# Patient Record
Sex: Male | Born: 1950 | Race: White | Hispanic: No | Marital: Married | Smoking: Former smoker
Health system: Southern US, Community
[De-identification: ages and names within clinical notes are randomized; demographics above are authoritative.]

## PROBLEM LIST (undated history)

## (undated) DIAGNOSIS — N200 Calculus of kidney: Secondary | ICD-10-CM

## (undated) DIAGNOSIS — E785 Hyperlipidemia, unspecified: Secondary | ICD-10-CM

## (undated) DIAGNOSIS — I441 Atrioventricular block, second degree: Secondary | ICD-10-CM

## (undated) DIAGNOSIS — I10 Essential (primary) hypertension: Secondary | ICD-10-CM

## (undated) HISTORY — DX: Essential (primary) hypertension: I10

## (undated) HISTORY — DX: Atrioventricular block, second degree: I44.1

## (undated) HISTORY — PX: SHOULDER SURGERY: SHX246

## (undated) HISTORY — DX: Hyperlipidemia, unspecified: E78.5

---

## 1999-07-22 ENCOUNTER — Ambulatory Visit (HOSPITAL_COMMUNITY): Admission: RE | Admit: 1999-07-22 | Discharge: 1999-07-22 | Payer: Self-pay | Admitting: Interventional Cardiology

## 1999-11-22 ENCOUNTER — Encounter: Admission: RE | Admit: 1999-11-22 | Discharge: 1999-11-22 | Payer: Self-pay | Admitting: Urology

## 1999-11-22 ENCOUNTER — Encounter: Payer: Self-pay | Admitting: Emergency Medicine

## 1999-11-22 ENCOUNTER — Encounter: Payer: Self-pay | Admitting: Urology

## 1999-11-22 ENCOUNTER — Emergency Department (HOSPITAL_COMMUNITY): Admission: EM | Admit: 1999-11-22 | Discharge: 1999-11-22 | Payer: Self-pay | Admitting: Emergency Medicine

## 1999-11-30 ENCOUNTER — Encounter: Admission: RE | Admit: 1999-11-30 | Discharge: 1999-11-30 | Payer: Self-pay | Admitting: Urology

## 1999-11-30 ENCOUNTER — Encounter: Payer: Self-pay | Admitting: Urology

## 1999-12-07 ENCOUNTER — Encounter: Admission: RE | Admit: 1999-12-07 | Discharge: 1999-12-07 | Payer: Self-pay | Admitting: Urology

## 1999-12-07 ENCOUNTER — Encounter: Payer: Self-pay | Admitting: Urology

## 1999-12-16 ENCOUNTER — Encounter: Payer: Self-pay | Admitting: Urology

## 1999-12-19 ENCOUNTER — Ambulatory Visit (HOSPITAL_COMMUNITY): Admission: RE | Admit: 1999-12-19 | Discharge: 1999-12-19 | Payer: Self-pay | Admitting: Urology

## 1999-12-23 ENCOUNTER — Encounter: Payer: Self-pay | Admitting: Urology

## 1999-12-23 ENCOUNTER — Encounter: Admission: RE | Admit: 1999-12-23 | Discharge: 1999-12-23 | Payer: Self-pay | Admitting: Urology

## 2000-01-03 ENCOUNTER — Encounter: Admission: RE | Admit: 2000-01-03 | Discharge: 2000-01-03 | Payer: Self-pay | Admitting: Urology

## 2000-01-03 ENCOUNTER — Encounter: Payer: Self-pay | Admitting: Urology

## 2000-06-19 ENCOUNTER — Encounter: Payer: Self-pay | Admitting: Urology

## 2000-06-19 ENCOUNTER — Encounter: Admission: RE | Admit: 2000-06-19 | Discharge: 2000-06-19 | Payer: Self-pay | Admitting: Urology

## 2000-06-22 ENCOUNTER — Encounter: Admission: RE | Admit: 2000-06-22 | Discharge: 2000-06-22 | Payer: Self-pay | Admitting: Urology

## 2000-06-22 ENCOUNTER — Encounter: Payer: Self-pay | Admitting: Urology

## 2000-07-19 ENCOUNTER — Observation Stay (HOSPITAL_COMMUNITY): Admission: RE | Admit: 2000-07-19 | Discharge: 2000-07-20 | Payer: Self-pay | Admitting: Urology

## 2000-08-24 ENCOUNTER — Encounter: Admission: RE | Admit: 2000-08-24 | Discharge: 2000-08-24 | Payer: Self-pay | Admitting: Urology

## 2000-08-24 ENCOUNTER — Encounter: Payer: Self-pay | Admitting: Urology

## 2004-09-23 ENCOUNTER — Ambulatory Visit (HOSPITAL_COMMUNITY): Admission: RE | Admit: 2004-09-23 | Discharge: 2004-09-23 | Payer: Self-pay | Admitting: Gastroenterology

## 2011-05-11 ENCOUNTER — Other Ambulatory Visit: Payer: Self-pay | Admitting: Dermatology

## 2014-05-29 ENCOUNTER — Encounter: Payer: Self-pay | Admitting: Podiatrist

## 2014-05-29 ENCOUNTER — Ambulatory Visit (INDEPENDENT_AMBULATORY_CARE_PROVIDER_SITE_OTHER): Payer: 59 | Admitting: Podiatrist

## 2014-05-29 VITALS — BP 132/81 | HR 64 | Resp 18

## 2014-05-29 DIAGNOSIS — M6740 Ganglion, unspecified site: Secondary | ICD-10-CM

## 2014-05-29 DIAGNOSIS — M67472 Ganglion, left ankle and foot: Secondary | ICD-10-CM

## 2014-05-29 NOTE — Patient Instructions (Signed)

## 2014-05-29 NOTE — Progress Notes (Signed)
   Subjective:    Patient ID: Leonard Maldonado, male    DOB: 04-Jan-1951, 63 y.o.   MRN: [id number]  HPI went on a cruise in December 2014 and felt like a stone bruise and is sore and tender and I can feel it when barefoot and it is on the bottom of my left foot    Review of Systems  All other systems reviewed and are negative.      Objective:   Physical Exam Patient is awake, alert, and oriented x 3.  In no acute distress.  Vascular status is intact with palpable pedal pulses at 2/4 DP and PT bilateral and capillary refill time within normal limits. Neurological sensation is also intact bilaterally via Semmes Weinstein monofilament at 5/5 sites. Light touch, vibratory sensation, Achilles tendon reflex is intact. Dermatological exam reveals skin color, turger and texture as normal. No open lesions present.  Musculature intact with dorsiflexion, plantarflexion, inversion, eversion.  Palpable soft tissue sister lesion is present submetatarsal 1 of the left foot. It feels like a large marble is present. It is soft and freely movable in comparison with the right foot       Assessment & Plan:  Cyst submetatarsal one left  Plan: Recommended a drainage and this was attempted at today's visit under sterile technique I was unable to really obtain much fluid. I did inject the area with Kenalog and Marcaine mixture. And recommended that we may need to remove the cyst if it continues to be problematic. Did send the fluid for analysis. Through [facility]

## 2014-06-01 ENCOUNTER — Telehealth: Payer: Self-pay | Admitting: *Deleted

## 2014-06-01 NOTE — Telephone Encounter (Signed)
Aspiration left foot sent to [facility] to rule out Ganglion, Lipoma or Sarcoma.  [facility] [address] [place] [address] Ph:  [phone]

## 2014-06-16 ENCOUNTER — Encounter: Payer: Self-pay | Admitting: Podiatrist

## 2014-06-19 ENCOUNTER — Encounter: Payer: Self-pay | Admitting: Podiatrist

## 2015-03-08 ENCOUNTER — Other Ambulatory Visit: Payer: Self-pay | Admitting: Internal Medicine

## 2015-03-08 ENCOUNTER — Ambulatory Visit
Admission: RE | Admit: 2015-03-08 | Discharge: 2015-03-08 | Disposition: A | Discharge status: Home / Self Care | Payer: PRIVATE HEALTH INSURANCE | Source: Ambulatory Visit | Attending: Internal Medicine | Admitting: Internal Medicine

## 2015-03-08 DIAGNOSIS — R059 Cough, unspecified: Secondary | ICD-10-CM

## 2015-03-08 DIAGNOSIS — R05 Cough: Secondary | ICD-10-CM

## 2015-03-19 ENCOUNTER — Encounter (INDEPENDENT_AMBULATORY_CARE_PROVIDER_SITE_OTHER): Payer: Self-pay

## 2015-03-19 ENCOUNTER — Ambulatory Visit (INDEPENDENT_AMBULATORY_CARE_PROVIDER_SITE_OTHER): Payer: Medicare Other | Admitting: Pulmonary Disease

## 2015-03-19 ENCOUNTER — Encounter: Payer: Self-pay | Admitting: Pulmonary Disease

## 2015-03-19 VITALS — BP 124/80 | HR 66 | Temp 97.6°F | Ht 73.0 in | Wt 227.0 lb

## 2015-03-19 DIAGNOSIS — J45909 Unspecified asthma, uncomplicated: Secondary | ICD-10-CM | POA: Insufficient documentation

## 2015-03-19 DIAGNOSIS — R059 Cough, unspecified: Secondary | ICD-10-CM

## 2015-03-19 DIAGNOSIS — R05 Cough: Secondary | ICD-10-CM

## 2015-03-19 DIAGNOSIS — J4520 Mild intermittent asthma, uncomplicated: Secondary | ICD-10-CM | POA: Diagnosis not present

## 2015-03-19 MED ORDER — METHYLPREDNISOLONE ACETATE 80 MG/ML IJ SUSP: 80.0000 mg | Freq: Once | INTRAMUSCULAR | Status: DC

## 2015-03-19 MED ORDER — HYDROCODONE-HOMATROPINE 5-1.5 MG/5ML PO SYRP: 5.0000 mL | ORAL_SOLUTION | Freq: Four times a day (QID) | ORAL | Status: DC | PRN

## 2015-03-19 MED ORDER — PREDNISONE 20 MG PO TABS: ORAL_TABLET | ORAL | Status: DC

## 2015-03-19 NOTE — Patient Instructions (Signed)
Leonard Maldonado, it was nice meeting you today...  For your "asthmatic bronchitis" we have recommended>    1) as short course of PREDNISONE 20mg  tabs for the inflammation in your airways       Start w/ one tab twice daily for 4 days...       Then decrease to 1 tab daily each AM for 4 days...       Then decrease to 1/2 tab daily each AM for 4 days...       Then decrease to 1/2 tab every other day til gone (1/2, 0, 1/2, 0, etc)...  You may use the HYCODAN cough syrup- one tsp everh 6h as needed...  Call for any questions or if we can be of service in any way.Marland KitchenMarland Kitchen

## 2015-03-20 ENCOUNTER — Encounter: Payer: Self-pay | Admitting: Pulmonary Disease

## 2015-03-20 NOTE — Progress Notes (Addendum)
Subjective:     Patient ID: Leonard Maldonado, male   DOB: 03/16/1951, 64 y.o.   MRN: [id number]  HPI 64 y/o WM, referred by Leonard Maldonado at Leonard Maldonado, for evaluation of cough>>  Pt relates a 66mo hx cough starting around 01/12/15 when he had "the crud" treated w/ Leonard Maldonado, Leonard Maldonado, cough syrup initially w/ only min improvement; Then he was sent to Leonard Maldonado, ENT for eval and he scoped his throat & changed OTC Ranitadine he was taking to Omeprazole40Bid- sl better but some cough persisted;  Symptoms include cough, sl yellow sput, no hemoptysis, ?occas mild SOB, no CP etc; he can mow his yard w/o stopping etc...      He denies prev hx lung disease...      He is an ex-smoker- starting in his teens and quit in his 20's for a 10 yr hx of up to 1ppd (10 pack-yrs max, & quit in the 1970's)...      FamHx is pos for COPD/em[physema in father who was a smoker & died from lung ca at age 45; no other lung prob in family although one Bro ?exposed to asbestos...      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           Sinus drainage> occas symptoms, he was eval by Leonard Maldonado, ENT & apparently nothing found (we do not have notes).           Asthma> never been diagnosed or treated, however he notes that prev URI/colds/bronchitis are "hard to shake".           Acid reflux> denies much reflux symptoms but Leonard Maldonado changed his Ranitadine to Omep40Bid.  EXAM showed Afeb, VSS, O2sat=99%;  HEENT- neg x sl red, mallampati2;  Chest- clear w/o w/r/r, dry cough noted;  Heart- RR w/o m/r/g;  Abd- soft, neg;  Ext- w/o c/c/e...   CXR 03/08/15 showed norm heart size, clear lungs w/ ?mild basilar atelectasis, otherw neg.Marland Kitchen.(I personally reviewed the images in [facility] Maldonado- no signs of  asbestosis).  Spiromety 03/19/15 revealed FVC=4.59 (90%), FEV1=3.85 (99%), %1sec=84, mid-flows are wnl at 140% predicted> normal spirometry, no airflow obstruction.  Ambulatory O2 saturation test> O2sat=96% on RA at rest w/ pulse=70/min; he walked 3 laps w/ lowest O2sat=96% w/ pulse=94/min  IMP/ PLAN>>  Leonard Maldonado's hx is suggestive of reactive airways disease (RADS) having been triggered by "the crud" he experienced in early March;  He was sl better w/ Leonard Maldonado, cough syrup, etc but the symptoms persisted;  He was evaluated by Leonard Maldonado, ENT who apparently diagnosed some LPR & treated him w/ Leonard Maldonado med and presumably an antireflux regimen- again sl further improvement;  I explained to him that upper resp infections and reflux are 2 common triggers for this airway inflammation in RADS and that he will likely require a brief course of PREDNISONE to get this resolved & out of his system;  In this regard we gave him a Leonard Maldonado shot & started Leonard Maldonado 20mg  tabs in a tapering schedule (see AVS);  We also wrote for some Leonard Maldonado to use prn...    Past Medical History  Diagnosis >> he takes a number of supplements Date  .  HTN (hypertension) >> on diet alone, no meds   . Hyperlipemia >> on Simva20    Reflux/ LPR >> on Omeprazole40     Past Surgical History  Procedure Laterality Date  . Shoulder surgery      Outpatient Encounter Prescriptions as of 03/19/2015  Medication Sig  . aspirin 81 MG tablet Take 81 mg by mouth daily.  . cetirizine (ZYRTEC) 10 MG tablet Take 10 mg by mouth daily.  . cholecalciferol (VITAMIN D) 1000 UNITS tablet Take 1,000 Units by mouth daily.  Marland Kitchen co-enzyme Q-10 50 MG capsule Take 50 mg by mouth daily.  . Multiple Vitamin (MULTIVITAMIN) tablet Take 1 tablet by mouth daily.  . Omega-3 Fatty Acids (FISH OIL) 1200 MG CAPS Take 12 mg by mouth.  Marland Kitchen omeprazole (PRILOSEC) 40 MG capsule   . simvastatin (ZOCOR) 20 MG tablet Take 20 mg by mouth daily.  . [DISCONTINUED] ranitidine (ZANTAC) 150 MG capsule Take  150 mg by mouth 2 (two) times daily.   No Known Allergies   Family History  Problem Relation Age of Onset  . Emphysema Father   . Lung cancer Father   Father died age 5 w/ lung cancer, hx COPD/emphysema, smoker... Mother died age 72 w/ Alzheimers dis... 4Siblings> 3Bro- one died after surg for ASPVD, one bro has DM ?asbestos; 1Sis- a/w   History   Social History  . Marital Status: Married    Spouse Name: N/A  . Number of Children: N/A  . Years of Education: N/A   Occupational History  . Not on file.   Social History Main Topics  . Smoking status: Former Smoker -- 1.00 packs/day for 10 years    Types: Cigarettes    Quit date: 03/18/1972  . Smokeless tobacco: Never Used  . Alcohol Use: No  . Drug Use: No  . Sexual Activity: Not on file   Other Topics Concern  . Not on file   Social History Narrative    Current Medications, Allergies, Past Medical History, Past Surgical History, Family History, and Social History were reviewed in [employer] record.   Review of Systems          All symptoms NEG except where BOLDED >>  Constitutional:  F/C/S, fatigue, anorexia, unexpected weight change. HEENT:  HA, visual changes, hearing loss, earache, nasal symptoms, sore throat, mouth sores, hoarseness. Resp:  cough, sputum, hemoptysis; SOB, tightness, wheezing. Cardio:  CP, palpit, DOE, orthopnea, edema. GI:  N/V/D/C, blood in stool; reflux, abd pain, distention, gas. GU:  dysuria, freq, urgency, hematuria, flank pain, voiding difficulty. MS:  joint pain, swelling, tenderness, decr ROM; neck pain, back pain, etc. Neuro:  HA, tremors, seizures, dizziness, syncope, weakness, numbness, gait abn. Skin:  suspicious lesions or skin rash. Heme:  adenopathy, bruising, bleeding. Psyche:  confusion, agitation, sleep disturbance, hallucinations, anxiety, depression suicidal.   Objective:   Physical Exam    Vital Signs:  Reviewed...  General:  WD, WN, 64 y/o  WM in NAD; alert & oriented; pleasant & cooperative... HEENT:  [place]/AT; Conjunctiva- pink, Sclera- nonicteric, EOM-wnl, PERRLA, Fundi-benign; EACs-clear, TMs-wnl; NOSE-clear; THROAT-clear & wnl. Neck:  Supple w/ fairROM; no JVD; normal carotid impulses w/o bruits; no thyromegaly or nodules palpated; no lymphadenopathy. Chest:  Clear to P & A; without wheezes, rales, or rhonchi heard. Heart:  Regular Rhythm; norm S1 & S2 without murmurs, rubs, or gallops detected. Abdomen:  Soft & nontender- no guarding or rebound; normal bowel sounds; no organomegaly or masses palpated. Ext:  Normal  ROM; without deformities or arthritic changes; no varicose veins, venous insuffic, or edema;  Pulses intact w/o bruits. Neuro:  CNs II-XII intact; motor testing normal; sensory testing normal; gait normal & balance OK. Derm:  No lesions noted; no rash etc. Lymph:  No cervical, supraclavicular, axillary, or inguinal adenopathy palpated.   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     Plan:     Patient's Medications  New Prescriptions   HYDROCODONE-HOMATROPINE (Leonard Maldonado) 5-1.5 MG/5ML SYRUP    Take 5 mLs by mouth every 6 (six) hours as needed for cough.   PREDNISONE (DELTASONE) 20 MG TABLET    Use as directed by physician during office visit  Previous Medications   ASPIRIN 81 MG TABLET    Take 81 mg by mouth daily.   CETIRIZINE (ZYRTEC) 10 MG TABLET    Take 10 mg by mouth daily.   CHOLECALCIFEROL (VITAMIN D) 1000 UNITS TABLET    Take 1,000 Units by mouth daily.   CO-ENZYME Q-10 50 MG CAPSULE    Take 50 mg by mouth daily.   MULTIPLE VITAMIN  (MULTIVITAMIN) TABLET    Take 1 tablet by mouth daily.   OMEGA-3 FATTY ACIDS (FISH OIL) 1200 MG CAPS    Take 12 mg by mouth.   OMEPRAZOLE (PRILOSEC) 40 MG CAPSULE       SIMVASTATIN (ZOCOR) 20 MG TABLET    Take 20 mg by mouth daily.  Modified Medications   No medications on file  Discontinued Medications   RANITIDINE (ZANTAC) 150 MG CAPSULE    Take 150 mg by mouth 2 (two) times daily.

## 2015-04-29 ENCOUNTER — Institutional Professional Consult (permissible substitution): Payer: PRIVATE HEALTH INSURANCE | Admitting: Pulmonary Disease

## 2015-09-16 ENCOUNTER — Other Ambulatory Visit: Payer: Self-pay | Admitting: Gastroenterology

## 2015-10-26 ENCOUNTER — Encounter (HOSPITAL_COMMUNITY): Payer: Self-pay | Admitting: *Deleted

## 2015-11-03 NOTE — Progress Notes (Signed)
11-03-15 1315 Spoke with pt after his spouse call to say he was "coughing a lot, no fever or other symptoms". She wanted to know if he should come for procedure. Instructed pt to call Dr. Wynetta Emery office to inform of cough, if they had no problems with his symptoms- should plan to report as instructed on 11-04-15.

## 2015-11-04 ENCOUNTER — Ambulatory Visit (HOSPITAL_COMMUNITY)
Admission: RE | Admit: 2015-11-04 | Discharge: 2015-11-04 | Disposition: A | Discharge status: Home / Self Care | Payer: Medicare Other | Source: Ambulatory Visit | Attending: Gastroenterology | Admitting: Gastroenterology

## 2015-11-04 ENCOUNTER — Encounter (HOSPITAL_COMMUNITY): Payer: Self-pay | Admitting: Anesthesiology

## 2015-11-04 ENCOUNTER — Encounter (HOSPITAL_COMMUNITY)
Admission: RE | Disposition: A | Discharge status: Home / Self Care | Payer: Self-pay | Source: Ambulatory Visit | Attending: Gastroenterology

## 2015-11-04 ENCOUNTER — Encounter (HOSPITAL_COMMUNITY): Payer: Self-pay | Admitting: *Deleted

## 2015-11-04 DIAGNOSIS — I10 Essential (primary) hypertension: Secondary | ICD-10-CM | POA: Insufficient documentation

## 2015-11-04 DIAGNOSIS — Z86010 Personal history of colonic polyps: Secondary | ICD-10-CM | POA: Diagnosis not present

## 2015-11-04 DIAGNOSIS — Z1211 Encounter for screening for malignant neoplasm of colon: Secondary | ICD-10-CM | POA: Insufficient documentation

## 2015-11-04 DIAGNOSIS — K219 Gastro-esophageal reflux disease without esophagitis: Secondary | ICD-10-CM | POA: Insufficient documentation

## 2015-11-04 DIAGNOSIS — E7800 Pure hypercholesterolemia, unspecified: Secondary | ICD-10-CM | POA: Insufficient documentation

## 2015-11-04 DIAGNOSIS — D122 Benign neoplasm of ascending colon: Secondary | ICD-10-CM | POA: Insufficient documentation

## 2015-11-04 DIAGNOSIS — N400 Enlarged prostate without lower urinary tract symptoms: Secondary | ICD-10-CM | POA: Insufficient documentation

## 2015-11-04 DIAGNOSIS — Z85828 Personal history of other malignant neoplasm of skin: Secondary | ICD-10-CM | POA: Insufficient documentation

## 2015-11-04 HISTORY — PX: COLONOSCOPY WITH PROPOFOL: SHX5780

## 2015-11-04 SURGERY — COLONOSCOPY WITH PROPOFOL
Anesthesia: Monitor Anesthesia Care

## 2015-11-04 MED ORDER — SODIUM CHLORIDE 0.9 % IV SOLN: INTRAVENOUS | Status: DC

## 2015-11-04 MED ORDER — MIDAZOLAM HCL 10 MG/2ML IJ SOLN
INTRAMUSCULAR | Status: DC | PRN
  Administered 2015-11-04: 1 mg via INTRAVENOUS
  Administered 2015-11-04 (×3): 2 mg via INTRAVENOUS

## 2015-11-04 MED ORDER — FENTANYL CITRATE (PF) 100 MCG/2ML IJ SOLN
INTRAMUSCULAR | Status: DC | PRN
  Administered 2015-11-04 (×4): 25 ug via INTRAVENOUS

## 2015-11-04 MED ORDER — PROPOFOL 10 MG/ML IV BOLUS
INTRAVENOUS | Status: AC
  Filled 2015-11-04: qty 40

## 2015-11-04 MED ORDER — LACTATED RINGERS IV SOLN
INTRAVENOUS | Status: DC
  Administered 2015-11-04: 1000 mL via INTRAVENOUS

## 2015-11-04 MED ORDER — FENTANYL CITRATE (PF) 100 MCG/2ML IJ SOLN
INTRAMUSCULAR | Status: AC
  Filled 2015-11-04: qty 2

## 2015-11-04 MED ORDER — DIPHENHYDRAMINE HCL 50 MG/ML IJ SOLN
INTRAMUSCULAR | Status: AC
  Filled 2015-11-04: qty 1

## 2015-11-04 MED ORDER — MIDAZOLAM HCL 5 MG/ML IJ SOLN
INTRAMUSCULAR | Status: AC
  Filled 2015-11-04: qty 2

## 2015-11-04 MED ORDER — LIDOCAINE HCL (CARDIAC) 20 MG/ML IV SOLN
INTRAVENOUS | Status: AC
  Filled 2015-11-04: qty 5

## 2015-11-04 SURGICAL SUPPLY — 21 items

## 2015-11-04 NOTE — Op Note (Signed)
Procedure: Surveillance colonoscopy. Adenomatous colon polyps removed colonoscopically in the past. Normal surveillance colonoscopy performed on 09/13/2010  Endoscopist: Earle Gell  Premedication: Versed 7 mg. Fentanyl 100 g.  Procedure: The patient was placed in the left lateral position. Anal inspection and digital rectal exam were normal. The Pentax pediatric colonoscope was introduced into the rectum and advanced to the cecum. A normal-appearing ileocecal valve and appendiceal orifice were identified. Colonic preparation for the exam today was good. Withdrawal time was 11 minutes  Rectum. Normal. Retroflexed view of the distal rectum was normal  Sigmoid colon and descending colon. Normal  Splenic flexure. Normal  Transverse colon. Normal  Hepatic flexure. Normal  Ascending colon. From the distal ascending colon, a 2 mm sessile polyp was removed with the cold biopsy forceps  Cecum and ileocecal valve. Normal  Assessment: A diminutive polyp was removed from the distal ascending colon; otherwise normal colonoscopy.  Recommendation: Schedule surveillance colonoscopy in 5 years

## 2015-11-04 NOTE — H&P (Signed)
  Procedure: Surveillance colonoscopy. Normal surveillance colonoscopy performed on 09/13/2010. Adenomatous colon polyps removed colonoscopically in the past  History: The patient is a 64 year old male born October 11, 1951. He is scheduled to undergo a surveillance colonoscopy today.  Medication allergies: Lipitor causes myalgias. Hydrochlorothiazide caused an increase in his serum creatinine.  Past medical history: Right shoulder surgery. 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 Kidney stone. Hypercholesterolemia. Hypertension. Gastroesophageal reflux. Seasonal rhinitis. Benign prostatic hypertrophy.  Exam: The patient is alert and lying comfortably on the endoscopy stretcher. Abdomen is soft and nontender to palpation. Lungs clear to auscultation. Cardiac exam reveals a regular rhythm.  Plan: Proceed with surveillance colonoscopy

## 2015-11-04 NOTE — Discharge Instructions (Signed)
Colonoscopy, Care After °Refer to this sheet in the next few weeks. These instructions provide you with information on caring for yourself after your procedure. Your health care provider may also give you more specific instructions. Your treatment has been planned according to current medical practices, but problems sometimes occur. Call your health care provider if you have any problems or questions after your procedure. °WHAT TO EXPECT AFTER THE PROCEDURE  °After your procedure, it is typical to have the following: °· A small amount of blood in your stool. °· Moderate amounts of gas and mild abdominal cramping or bloating. °HOME CARE INSTRUCTIONS °· Do not drive, operate machinery, or sign important documents for 24 hours. °· You may shower and resume your regular physical activities, but move at a slower pace for the first 24 hours. °· Take frequent rest periods for the first 24 hours. °· Walk around or put a warm pack on your abdomen to help reduce abdominal cramping and bloating. °· Drink enough fluids to keep your urine clear or pale yellow. °· You may resume your normal diet as instructed by your health care provider. Avoid heavy or fried foods that are hard to digest. °· Avoid drinking alcohol for 24 hours or as instructed by your health care provider. °· Only take over-the-counter or prescription medicines as directed by your health care provider. °· If a tissue sample (biopsy) was taken during your procedure: °¨ Do not take aspirin or blood thinners for 7 days, or as instructed by your health care provider. °¨ Do not drink alcohol for 7 days, or as instructed by your health care provider. °¨ Eat soft foods for the first 24 hours. °SEEK MEDICAL CARE IF: °You have persistent spotting of blood in your stool 2-3 days after the procedure. °SEEK IMMEDIATE MEDICAL CARE IF: °· You have more than a small spotting of blood in your stool. °· You pass large blood clots in your stool. °· Your abdomen is swollen  (distended). °· You have nausea or vomiting. °· You have a fever. °· You have increasing abdominal pain that is not relieved with medicine. °  °This information is not intended to replace advice given to you by your health care provider. Make sure you discuss any questions you have with your health care provider. °  °Document Released: 06/13/2004 Document Revised: 08/20/2013 Document Reviewed: 07/07/2013 °Elsevier Interactive Patient Education ©2016 [employer] ° °

## 2015-11-05 ENCOUNTER — Encounter (HOSPITAL_COMMUNITY): Payer: Self-pay | Admitting: Gastroenterology

## 2018-02-11 ENCOUNTER — Other Ambulatory Visit: Payer: Self-pay | Admitting: Internal Medicine

## 2018-02-11 ENCOUNTER — Ambulatory Visit
Admission: RE | Admit: 2018-02-11 | Discharge: 2018-02-11 | Disposition: A | Discharge status: Home / Self Care | Payer: Medicare Other | Source: Ambulatory Visit | Attending: Internal Medicine | Admitting: Internal Medicine

## 2018-02-11 DIAGNOSIS — M545 Low back pain: Secondary | ICD-10-CM

## 2018-10-18 ENCOUNTER — Other Ambulatory Visit: Payer: Self-pay

## 2018-10-18 ENCOUNTER — Emergency Department (HOSPITAL_COMMUNITY): Payer: Medicare Other

## 2018-10-18 ENCOUNTER — Encounter (HOSPITAL_COMMUNITY): Payer: Self-pay

## 2018-10-18 ENCOUNTER — Emergency Department (HOSPITAL_COMMUNITY)
Admission: EM | Admit: 2018-10-18 | Discharge: 2018-10-18 | Disposition: A | Discharge status: Home / Self Care | Payer: Medicare Other | Attending: Emergency Medicine | Admitting: Emergency Medicine

## 2018-10-18 DIAGNOSIS — K529 Noninfective gastroenteritis and colitis, unspecified: Secondary | ICD-10-CM | POA: Insufficient documentation

## 2018-10-18 DIAGNOSIS — Z7982 Long term (current) use of aspirin: Secondary | ICD-10-CM | POA: Diagnosis not present

## 2018-10-18 DIAGNOSIS — Z87891 Personal history of nicotine dependence: Secondary | ICD-10-CM | POA: Insufficient documentation

## 2018-10-18 DIAGNOSIS — I10 Essential (primary) hypertension: Secondary | ICD-10-CM | POA: Diagnosis not present

## 2018-10-18 DIAGNOSIS — R1030 Lower abdominal pain, unspecified: Secondary | ICD-10-CM | POA: Diagnosis present

## 2018-10-18 DIAGNOSIS — Z79899 Other long term (current) drug therapy: Secondary | ICD-10-CM | POA: Diagnosis not present

## 2018-10-18 LAB — CBC
HCT: 43.9 % (ref 39.0–52.0)
Hemoglobin: 14.5 g/dL (ref 13.0–17.0)
MCH: 29.7 pg (ref 26.0–34.0)
MCHC: 33 g/dL (ref 30.0–36.0)
MCV: 90 fL (ref 80.0–100.0)
NRBC: 0 % (ref 0.0–0.2)
PLATELETS: 160 10*3/uL (ref 150–400)
RBC: 4.88 MIL/uL (ref 4.22–5.81)
RDW: 12.9 % (ref 11.5–15.5)
WBC: 9.1 10*3/uL (ref 4.0–10.5)

## 2018-10-18 LAB — ABO/RH: ABO/RH(D): A POS

## 2018-10-18 LAB — COMPREHENSIVE METABOLIC PANEL
ALK PHOS: 47 U/L (ref 38–126)
ALT: 39 U/L (ref 0–44)
AST: 35 U/L (ref 15–41)
Albumin: 4.2 g/dL (ref 3.5–5.0)
Anion gap: 8 (ref 5–15)
BILIRUBIN TOTAL: 1 mg/dL (ref 0.3–1.2)
BUN: 16 mg/dL (ref 8–23)
CALCIUM: 8.9 mg/dL (ref 8.9–10.3)
CO2: 25 mmol/L (ref 22–32)
CREATININE: 1.33 mg/dL — AB (ref 0.61–1.24)
Chloride: 106 mmol/L (ref 98–111)
GFR, EST NON AFRICAN AMERICAN: 54 mL/min — AB (ref >60)
Glucose, Bld: 103 mg/dL — ABNORMAL HIGH (ref 70–99)
Potassium: 3.9 mmol/L (ref 3.5–5.1)
Sodium: 139 mmol/L (ref 135–145)
TOTAL PROTEIN: 7.1 g/dL (ref 6.5–8.1)

## 2018-10-18 LAB — TYPE AND SCREEN
ABO/RH(D): A POS
ANTIBODY SCREEN: NEGATIVE

## 2018-10-18 LAB — LIPASE, BLOOD: Lipase: 30 U/L (ref 11–51)

## 2018-10-18 LAB — POC OCCULT BLOOD, ED: Fecal Occult Bld: POSITIVE — AB

## 2018-10-18 MED ORDER — METRONIDAZOLE 500 MG PO TABS
500.0000 mg | ORAL_TABLET | Freq: Once | ORAL | Status: AC
  Administered 2018-10-18: 500 mg via ORAL
  Filled 2018-10-18: qty 1

## 2018-10-18 MED ORDER — IOPAMIDOL (ISOVUE-300) INJECTION 61%
100.0000 mL | Freq: Once | INTRAVENOUS | Status: AC | PRN
  Administered 2018-10-18: 100 mL via INTRAVENOUS

## 2018-10-18 MED ORDER — METRONIDAZOLE 500 MG PO TABS: 500.0000 mg | ORAL_TABLET | Freq: Two times a day (BID) | ORAL | 0 refills | Status: DC

## 2018-10-18 MED ORDER — IOPAMIDOL (ISOVUE-300) INJECTION 61%
INTRAVENOUS | Status: AC
  Filled 2018-10-18: qty 100

## 2018-10-18 MED ORDER — CIPROFLOXACIN HCL 500 MG PO TABS: 500.0000 mg | ORAL_TABLET | Freq: Two times a day (BID) | ORAL | 0 refills | Status: DC

## 2018-10-18 MED ORDER — SODIUM CHLORIDE (PF) 0.9 % IJ SOLN
INTRAMUSCULAR | Status: AC
  Filled 2018-10-18: qty 50

## 2018-10-18 MED ORDER — CIPROFLOXACIN HCL 500 MG PO TABS
500.0000 mg | ORAL_TABLET | Freq: Once | ORAL | Status: AC
  Administered 2018-10-18: 500 mg via ORAL
  Filled 2018-10-18: qty 1

## 2018-10-18 MED ORDER — ONDANSETRON HCL 4 MG PO TABS: 4.0000 mg | ORAL_TABLET | Freq: Four times a day (QID) | ORAL | 0 refills | Status: DC

## 2018-10-18 NOTE — ED Notes (Signed)
Patient transported to CT 

## 2018-10-18 NOTE — Discharge Instructions (Addendum)
Please return for any problem. Take antibiotics as prescribed. Use Tylenol for pain. Do not take Aspirin for the next week.   Follow-up with your regular care providers on Monday as instructed.  Return for increased pain, nausea, vomiting, fever, or increased bleeding.

## 2018-10-18 NOTE — ED Triage Notes (Signed)
Pt states at 0230, he woke up with abdominal pain. Pt states that he had a solid bowel movement, but then went to liquid. Pt states that he noticed red in his stool and is concerned for blood in stool. Pt states pain is in the lower part of his abdomen. Pt states he could not get comfortable last night. Pt states pain eased up some with a shower.

## 2018-10-18 NOTE — ED Provider Notes (Signed)
[facility] DEPT Provider Note   CSN: [id number] Arrival date & time: 10/18/18  1219     History   Chief Complaint Chief Complaint  Patient presents with  . Rectal Bleeding    HPI Leonard Maldonado is a 67 y.o. male.  67 year old male with prior medical history as detailed below presents for evaluation of abdominal pain with possible blood in the stool.  Patient reports that he had low abdominal crampy discomfort that started overnight.  This was associated with loose stools early this morning.  Patient reports he thought he saw some blood in his stool this morning.  He denies nausea or vomiting.  He denies fever.  He reports that his symptoms are somewhat better now following his recent bowel movement.  He takes an ASA every 3 days.   The history is provided by the patient and medical records.  Abdominal Pain   This is a new problem. The current episode started 12 to 24 hours ago. The problem occurs rarely. The problem has not changed since onset.The pain is located in the suprapubic region. The quality of the pain is aching. The pain is mild. Associated symptoms include hematochezia. Pertinent negatives include fever and vomiting. Nothing aggravates the symptoms. Nothing relieves the symptoms.    Past Medical History:  Diagnosis Date  . HTN (hypertension)   . Hyperlipemia     Patient Active Problem List   Diagnosis Date Noted  . 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 Cough 03/19/2015    Past Surgical History:  Procedure Laterality Date  . COLONOSCOPY WITH PROPOFOL N/A 11/04/2015   Procedure: COLONOSCOPY WITH PROPOFOL;  Surgeon: Garlan Fair, MD;  Location: [facility] ENDOSCOPY;  Service: Endoscopy;  Laterality: N/A;  . SHOULDER SURGERY          Home Medications    Prior to Admission medications   Medication Sig Start Date End Date Taking? Authorizing Provider  aspirin 81 MG tablet Take 81 mg by mouth See admin instructions. Takes every third  day.    [provider]  cetirizine (ZYRTEC) 10 MG tablet Take 10 mg by mouth daily as needed for allergies.     [provider]  cholecalciferol (VITAMIN D) 1000 UNITS tablet Take 1,000 Units by mouth daily.    [provider]  COENZYME Q-10 PO Take 1 tablet by mouth daily.    [provider]  finasteride (PROSCAR) 5 MG tablet Take 5 mg by mouth daily. 08/05/18   [provider]  latanoprost (XALATAN) 0.005 % ophthalmic solution Place 1 drop into both eyes at bedtime. 10/15/18   [provider]  Multiple Vitamin (MULTIVITAMIN) tablet Take 1 tablet by mouth daily.    [provider]  Omega-3 Fatty Acids (FISH OIL) 1200 MG CAPS Take 1,200 mg by mouth daily.     [provider]  omeprazole (PRILOSEC) 40 MG capsule Take 40 mg by mouth 2 (two) times daily with a meal.  03/01/15   [provider]  polyvinyl alcohol (LIQUIFILM TEARS) 1.4 % ophthalmic solution Place 2 drops into both eyes every 8 (eight) hours as needed for dry eyes.    [provider]  potassium gluconate 595 MG TABS tablet Take 595 mg by mouth daily.    [provider]  simvastatin (ZOCOR) 20 MG tablet Take 20 mg by mouth daily.    [provider]  tamsulosin (FLOMAX) 0.4 MG CAPS capsule Take 0.8 mg by mouth daily. 08/27/18   [provider]    Family History Family History  Problem Relation Age of Onset  . Emphysema Father   . Lung cancer Father     Social History Social History   Tobacco Use  . Smoking status: Former Smoker    Packs/day: 1.00    Years: 10.00    Pack years: 10.00    Types: Cigarettes    Last attempt to quit: 03/18/1972    Years since quitting: 46.6  . Smokeless tobacco: Never Used  Substance Use Topics  . Alcohol use: No    Alcohol/week: 0.0 standard drinks  . Drug use: No     Allergies   Patient has no known allergies.   Review of Systems Review of Systems  Constitutional:  Negative for fever.  Gastrointestinal: Positive for abdominal pain and hematochezia. Negative for vomiting.  All other systems reviewed and are negative.    Physical Exam Updated Vital Signs BP (!) 151/89 (BP Location: Left Arm)   Pulse 90   Temp 97.9 F (36.6 C) (Oral)   Resp 18   Ht 6\' 1"  (1.854 m)   Wt 99.8 kg   SpO2 98%   BMI 29.03 kg/m   Physical Exam  Constitutional: He is oriented to person, place, and time. He appears well-developed and well-nourished. No distress.  HENT:  Head: Normocephalic and atraumatic.  Mouth/Throat: Oropharynx is clear and moist.  Eyes: Pupils are equal, round, and reactive to light. Conjunctivae and EOM are normal.  Neck: Normal range of motion. Neck supple.  Cardiovascular: Normal rate, regular rhythm and normal heart sounds.  Pulmonary/Chest: Effort normal and breath sounds normal. No respiratory distress.  Abdominal: Soft. Bowel sounds are normal. He exhibits no distension. There is tenderness.  Mild diffuse lower abdomen tenderness   Musculoskeletal: Normal range of motion. He exhibits no edema or deformity.  Neurological: He is alert and oriented to person, place, and time. No cranial nerve deficit.  Skin: Skin is warm and dry.  Psychiatric: He has a normal mood and affect.  Nursing note and vitals reviewed.    ED Treatments / Results  Labs (all labs ordered are listed, but only abnormal results are displayed) Labs Reviewed  COMPREHENSIVE METABOLIC PANEL - Abnormal; Notable for the following components:      Result Value   Glucose, Bld 103 (*)    Creatinine, Ser 1.33 (*)    GFR calc non Af Amer 54 (*)    All other components within normal limits  POC OCCULT BLOOD, ED - Abnormal; Notable for the following components:   Fecal Occult Bld POSITIVE (*)    All other components within normal limits  CBC  LIPASE, BLOOD  TYPE AND SCREEN  ABO/RH    EKG None  Radiology Ct Abdomen Pelvis W Contrast  Result Date:  10/18/2018 CLINICAL DATA:  Abdominal pain awakening the patient this morning at 0230 hours. Blood in stool. EXAM: CT ABDOMEN AND PELVIS WITH CONTRAST TECHNIQUE: Multidetector CT imaging of the abdomen and pelvis was performed using the standard protocol following bolus administration of intravenous contrast. CONTRAST:  155mL ISOVUE-300 IOPAMIDOL (ISOVUE-300) INJECTION 61% COMPARISON:  04/26/2016 FINDINGS: Lower chest: Normal heart size. No pericardial effusion or thickening. Minimal dependent atelectasis at the lung bases. Hepatobiliary: Steatosis of the liver. Nondistended gallbladder free of stones. No biliary dilatation. No hepatic mass. Pancreas: Unremarkable Spleen: Normal Adrenals/Urinary Tract: Normal bilateral adrenal glands. Punctate nonobstructing bilateral renal calculi are identified the largest in the interpolar aspect of the left kidney measuring approximately 6 mm.  Cortical scarring is seen in the lower pole of the left kidney. Stomach/Bowel: Decompressed stomach. Normal duodenal sweep and ligament of Treitz. No bowel obstruction in the distal and terminal ileum are normal. Normal appearing appendix. Stool retention within the right colon. Mild to moderate transmural thickening of the descending colon in keeping with colitis is noted. No annular constricting lesion or large bowel obstruction is noted. Vascular/Lymphatic: Aortoiliac atherosclerosis without aneurysm. Adenopathy. Reproductive: Top-normal prostate. Normal seminal vesicles. Other: No free air or free fluid. Musculoskeletal: Acute nor suspicious osseous abnormalities. Osteoarthritis with bridging osteophytes of the sacroiliac joints bilaterally. IMPRESSION: 1. Mild-to-moderate transmural thickening of the descending colon consistent with colitis. No bowel obstruction or perforation. 2. Hepatic steatosis. 3. Punctate nonobstructing bilateral renal calculi. 4. Aortoiliac atherosclerosis without aneurysm. Electronically Signed   By: Ashley Royalty M.D.   On: 10/18/2018 15:09    Procedures Procedures (including critical care time)  Medications Ordered in ED Medications  iopamidol (ISOVUE-300) 61 % injection (has no administration in time range)  sodium chloride (PF) 0.9 % injection (has no administration in time range)  iopamidol (ISOVUE-300) 61 % injection 100 mL (100 mLs Intravenous Contrast Given 10/18/18 1443)     Initial Impression / Assessment and Plan / ED Course  I have reviewed the triage vital signs and the nursing notes.  Pertinent labs & imaging results that were available during my care of the patient were reviewed by me and considered in my medical decision making (see chart for details).     MDM  Screen complete  Patient is presenting with symptoms consistent with early colitis. CT imaging supports this diagnosis. Patient's labs are reassuring - Hgb is 14.5, WBC normal, other labs without significant abnormality.   Patient without evidence of significant bleeding.   Abdominal exam is benign.   Patient offered admission for further treatment/observation. He declines same. He desires DC home.   He understands the need for close follow up. Strict return precautions given and understood.   Final Clinical Impressions(s) / ED Diagnoses   Final diagnoses:  Colitis    ED Discharge Orders         Ordered    ciprofloxacin (CIPRO) 500 MG tablet  2 times daily     10/18/18 1537    metroNIDAZOLE (FLAGYL) 500 MG tablet  2 times daily     10/18/18 1537    ondansetron (ZOFRAN) 4 MG tablet  Every 6 hours     10/18/18 1537           Valarie Merino, MD 10/18/18 1542

## 2018-10-18 NOTE — ED Notes (Signed)
PHLEBOTOMY NOTE: 1st set of culture sent to lab as extra/save

## 2019-01-27 ENCOUNTER — Ambulatory Visit: Payer: Medicare Other

## 2019-01-27 ENCOUNTER — Other Ambulatory Visit: Payer: Self-pay

## 2019-01-27 ENCOUNTER — Encounter: Payer: Self-pay | Admitting: Podiatry

## 2019-01-27 ENCOUNTER — Ambulatory Visit (INDEPENDENT_AMBULATORY_CARE_PROVIDER_SITE_OTHER): Payer: Medicare Other

## 2019-01-27 ENCOUNTER — Ambulatory Visit: Payer: Medicare Other | Admitting: Podiatry

## 2019-01-27 DIAGNOSIS — M779 Enthesopathy, unspecified: Secondary | ICD-10-CM

## 2019-01-27 DIAGNOSIS — M778 Other enthesopathies, not elsewhere classified: Secondary | ICD-10-CM

## 2019-01-27 MED ORDER — MELOXICAM 15 MG PO TABS: 15.0000 mg | ORAL_TABLET | Freq: Every day | ORAL | 3 refills | Status: DC

## 2019-01-27 NOTE — Progress Notes (Signed)
Subjective:  Patient ID: Leonard Maldonado, male    DOB: 02/17/1951,  MRN: [id number] HPI Chief Complaint  Patient presents with  . Foot Pain    Dorsal midfoot right - sharp pain x 5-6 months, shoes rub especially boots, tried tylenol and motrin   . New Patient (Initial Visit)    Est pt 35    68 y.o. male presents with the above complaint.   ROS: Denies fever chills nausea vomiting muscle aches pains calf pain back pain chest pain shortness of breath.  Past Medical History:  Diagnosis Date  . HTN (hypertension)   . Hyperlipemia    Past Surgical History:  Procedure Laterality Date  . COLONOSCOPY WITH PROPOFOL N/A 11/04/2015   Procedure: COLONOSCOPY WITH PROPOFOL;  Surgeon: Garlan Fair, MD;  Location: [facility];  Service: Endoscopy;  Laterality: N/A;  . SHOULDER SURGERY      Current Outpatient Medications:  .  Probiotic Product (PROBIOTIC DAILY PO), Take by mouth., Disp: , Rfl:  .  aspirin 81 MG tablet, Take 81 mg by mouth 2 (two) times a week. , Disp: , Rfl:  .  cholecalciferol (VITAMIN D) 1000 UNITS tablet, Take 1,000 Units by mouth daily., Disp: , Rfl:  .  COENZYME Q-10 PO, Take 1 tablet by mouth daily., Disp: , Rfl:  .  finasteride (PROSCAR) 5 MG tablet, Take 5 mg by mouth every evening. , Disp: , Rfl: 3 .  latanoprost (XALATAN) 0.005 % ophthalmic solution, Place 1 drop into both eyes at bedtime., Disp: , Rfl: 3 .  Magnesium 250 MG TABS, Take 250 mg by mouth 2 (two) times a week., Disp: , Rfl:  .  meloxicam (MOBIC) 15 MG tablet, Take 1 tablet (15 mg total) by mouth daily., Disp: 30 tablet, Rfl: 3 .  Multiple Vitamin (MULTIVITAMIN) tablet, Take 1 tablet by mouth daily., Disp: , Rfl:  .  Omega-3 Fatty Acids (FISH OIL) 1200 MG CAPS, Take 1,200 mg by mouth daily. , Disp: , Rfl:  .  omeprazole (PRILOSEC) 40 MG capsule, Take 40 mg by mouth 2 (two) times daily with a meal. , Disp: , Rfl:  .  simvastatin (ZOCOR) 20 MG tablet, Take 20 mg by mouth at bedtime. , Disp: , Rfl:  .   tamsulosin (FLOMAX) 0.4 MG CAPS capsule, Take 0.4 mg by mouth 2 (two) times daily. , Disp: , Rfl: 3  No Known Allergies Review of Systems Objective:  There were no vitals filed for this visit.  General: Well developed, nourished, in no acute distress, alert and oriented x3   Dermatological: Skin is warm, dry and supple bilateral. Nails x 10 are well maintained; remaining integument appears unremarkable at this time. There are no open sores, no preulcerative lesions, no rash or signs of infection present.  Vascular: Dorsalis Pedis artery and Posterior Tibial artery pedal pulses are 2/4 bilateral with immedate capillary fill time. Pedal hair growth present. No varicosities and no lower extremity edema present bilateral.   Neruologic: Grossly intact via light touch bilateral. Vibratory intact via tuning fork bilateral. Protective threshold with Semmes Wienstein monofilament intact to all pedal sites bilateral. Patellar and Achilles deep tendon reflexes 2+ bilateral. No Babinski or clonus noted bilateral.   Musculoskeletal: No gross boney pedal deformities bilateral. No pain, crepitus, or limitation noted with foot and ankle range of motion bilateral. Muscular strength 5/5 in all groups tested bilateral.  Pain on palpation in frontal plane range of motion of the first and second TMT  Gait: Unassisted,  Nonantalgic.    Radiographs:  Radiographs taken today demonstrate some osteoarthritic changes at the first and second TMT of the right foot some dorsal spurring is noted inks noted subchondral sclerosis and joint space narrowing.  Assessment & Plan:   Assessment: Osteoarthritis midfoot right  Plan: Discussed options today such as injection therapy and oral therapy as well as orthotics.  At this point he would like to try oral therapy.  Started him on meloxicam 15 mg he will take 5 to 7 days at a time and then discontinue and follow-up with me on an as-needed basis.     Trichelle Lehan T. [place], [place]

## 2019-09-16 ENCOUNTER — Inpatient Hospital Stay (HOSPITAL_COMMUNITY)
Admission: EM | Admit: 2019-09-16 | Discharge: 2019-09-17 | DRG: 229 | Disposition: A | Discharge status: Home / Self Care | Payer: Medicare Other | Attending: Internal Medicine | Admitting: Internal Medicine

## 2019-09-16 ENCOUNTER — Encounter (HOSPITAL_COMMUNITY)
Admission: EM | Disposition: A | Discharge status: Home / Self Care | Payer: Self-pay | Source: Home / Self Care | Attending: Cardiology

## 2019-09-16 ENCOUNTER — Inpatient Hospital Stay (HOSPITAL_COMMUNITY): Payer: Medicare Other

## 2019-09-16 ENCOUNTER — Encounter (HOSPITAL_COMMUNITY): Payer: Self-pay | Admitting: Emergency Medicine

## 2019-09-16 ENCOUNTER — Other Ambulatory Visit: Payer: Self-pay

## 2019-09-16 ENCOUNTER — Emergency Department (HOSPITAL_COMMUNITY): Payer: Medicare Other

## 2019-09-16 DIAGNOSIS — G8929 Other chronic pain: Secondary | ICD-10-CM | POA: Diagnosis present

## 2019-09-16 DIAGNOSIS — R0602 Shortness of breath: Secondary | ICD-10-CM

## 2019-09-16 DIAGNOSIS — Z79899 Other long term (current) drug therapy: Secondary | ICD-10-CM

## 2019-09-16 DIAGNOSIS — M549 Dorsalgia, unspecified: Secondary | ICD-10-CM | POA: Diagnosis present

## 2019-09-16 DIAGNOSIS — I340 Nonrheumatic mitral (valve) insufficiency: Secondary | ICD-10-CM

## 2019-09-16 DIAGNOSIS — R001 Bradycardia, unspecified: Secondary | ICD-10-CM

## 2019-09-16 DIAGNOSIS — Z006 Encounter for examination for normal comparison and control in clinical research program: Secondary | ICD-10-CM

## 2019-09-16 DIAGNOSIS — Z8679 Personal history of other diseases of the circulatory system: Secondary | ICD-10-CM

## 2019-09-16 DIAGNOSIS — N400 Enlarged prostate without lower urinary tract symptoms: Secondary | ICD-10-CM | POA: Diagnosis present

## 2019-09-16 DIAGNOSIS — Z801 Family history of malignant neoplasm of trachea, bronchus and lung: Secondary | ICD-10-CM | POA: Diagnosis not present

## 2019-09-16 DIAGNOSIS — Z87891 Personal history of nicotine dependence: Secondary | ICD-10-CM

## 2019-09-16 DIAGNOSIS — E785 Hyperlipidemia, unspecified: Secondary | ICD-10-CM | POA: Diagnosis present

## 2019-09-16 DIAGNOSIS — I361 Nonrheumatic tricuspid (valve) insufficiency: Secondary | ICD-10-CM

## 2019-09-16 DIAGNOSIS — R0683 Snoring: Secondary | ICD-10-CM | POA: Diagnosis present

## 2019-09-16 DIAGNOSIS — Z20828 Contact with and (suspected) exposure to other viral communicable diseases: Secondary | ICD-10-CM | POA: Diagnosis present

## 2019-09-16 DIAGNOSIS — Z825 Family history of asthma and other chronic lower respiratory diseases: Secondary | ICD-10-CM | POA: Diagnosis not present

## 2019-09-16 DIAGNOSIS — I10 Essential (primary) hypertension: Secondary | ICD-10-CM

## 2019-09-16 DIAGNOSIS — I441 Atrioventricular block, second degree: Secondary | ICD-10-CM

## 2019-09-16 DIAGNOSIS — Z950 Presence of cardiac pacemaker: Secondary | ICD-10-CM

## 2019-09-16 DIAGNOSIS — Z7982 Long term (current) use of aspirin: Secondary | ICD-10-CM | POA: Diagnosis not present

## 2019-09-16 HISTORY — PX: PACEMAKER LEADLESS INSERTION: EP1219

## 2019-09-16 LAB — TROPONIN I (HIGH SENSITIVITY)
Troponin I (High Sensitivity): 8 ng/L (ref <18)
Troponin I (High Sensitivity): 10 ng/L (ref <18)

## 2019-09-16 LAB — CBC WITH DIFFERENTIAL/PLATELET
Abs Immature Granulocytes: 0.02 10*3/uL (ref 0.00–0.07)
Basophils Absolute: 0.1 10*3/uL (ref 0.0–0.1)
Basophils Relative: 1 %
Eosinophils Absolute: 0.1 10*3/uL (ref 0.0–0.5)
Eosinophils Relative: 1 %
HCT: 42.6 % (ref 39.0–52.0)
Hemoglobin: 14.1 g/dL (ref 13.0–17.0)
Immature Granulocytes: 0 %
Lymphocytes Relative: 34 %
Lymphs Abs: 2.3 10*3/uL (ref 0.7–4.0)
MCH: 29.7 pg (ref 26.0–34.0)
MCHC: 33.1 g/dL (ref 30.0–36.0)
MCV: 89.9 fL (ref 80.0–100.0)
Monocytes Absolute: 0.7 10*3/uL (ref 0.1–1.0)
Monocytes Relative: 10 %
Neutro Abs: 3.8 10*3/uL (ref 1.7–7.7)
Neutrophils Relative %: 54 %
Platelets: 172 10*3/uL (ref 150–400)
RBC: 4.74 MIL/uL (ref 4.22–5.81)
RDW: 12.9 % (ref 11.5–15.5)
WBC: 6.9 10*3/uL (ref 4.0–10.5)
nRBC: 0 % (ref 0.0–0.2)

## 2019-09-16 LAB — BASIC METABOLIC PANEL
Anion gap: 11 (ref 5–15)
BUN: 16 mg/dL (ref 8–23)
CO2: 22 mmol/L (ref 22–32)
Calcium: 8.7 mg/dL — ABNORMAL LOW (ref 8.9–10.3)
Chloride: 105 mmol/L (ref 98–111)
Creatinine, Ser: 1.4 mg/dL — ABNORMAL HIGH (ref 0.61–1.24)
GFR calc Af Amer: 59 mL/min — ABNORMAL LOW (ref >60)
GFR calc non Af Amer: 51 mL/min — ABNORMAL LOW (ref >60)
Glucose, Bld: 128 mg/dL — ABNORMAL HIGH (ref 70–99)
Potassium: 3.8 mmol/L (ref 3.5–5.1)
Sodium: 138 mmol/L (ref 135–145)

## 2019-09-16 LAB — BRAIN NATRIURETIC PEPTIDE: B Natriuretic Peptide: 175.1 pg/mL — ABNORMAL HIGH (ref 0.0–100.0)

## 2019-09-16 LAB — HIV ANTIBODY (ROUTINE TESTING W REFLEX): HIV Screen 4th Generation wRfx: NONREACTIVE

## 2019-09-16 LAB — ECHOCARDIOGRAM COMPLETE
Height: 73 in
Weight: 3552 oz

## 2019-09-16 LAB — TSH: TSH: 4.702 u[IU]/mL — ABNORMAL HIGH (ref 0.350–4.500)

## 2019-09-16 LAB — SARS CORONAVIRUS 2 BY RT PCR (HOSPITAL ORDER, PERFORMED IN ~~LOC~~ HOSPITAL LAB): SARS Coronavirus 2: NEGATIVE

## 2019-09-16 SURGERY — PACEMAKER LEADLESS INSERTION

## 2019-09-16 MED ORDER — LATANOPROST 0.005 % OP SOLN
1.0000 [drp] | Freq: Every day | OPHTHALMIC | Status: DC
  Administered 2019-09-16: 1 [drp] via OPHTHALMIC
  Filled 2019-09-16: qty 2.5

## 2019-09-16 MED ORDER — ENOXAPARIN SODIUM 40 MG/0.4ML ~~LOC~~ SOLN
40.0000 mg | SUBCUTANEOUS | Status: DC
  Administered 2019-09-16: 40 mg via SUBCUTANEOUS
  Filled 2019-09-16: qty 0.4

## 2019-09-16 MED ORDER — IOHEXOL 350 MG/ML SOLN
INTRAVENOUS | Status: DC | PRN
  Administered 2019-09-16: 10 mL

## 2019-09-16 MED ORDER — MIDAZOLAM HCL 5 MG/5ML IJ SOLN
INTRAMUSCULAR | Status: AC
  Filled 2019-09-16: qty 5

## 2019-09-16 MED ORDER — FENTANYL CITRATE (PF) 100 MCG/2ML IJ SOLN
INTRAMUSCULAR | Status: AC
  Filled 2019-09-16: qty 2

## 2019-09-16 MED ORDER — FENTANYL CITRATE (PF) 100 MCG/2ML IJ SOLN
INTRAMUSCULAR | Status: DC | PRN
  Administered 2019-09-16: 25 ug via INTRAVENOUS

## 2019-09-16 MED ORDER — HEPARIN SODIUM (PORCINE) 1000 UNIT/ML IJ SOLN
INTRAMUSCULAR | Status: DC | PRN
  Administered 2019-09-16: 5000 [IU] via INTRAVENOUS

## 2019-09-16 MED ORDER — CEFAZOLIN SODIUM-DEXTROSE 2-4 GM/100ML-% IV SOLN
INTRAVENOUS | Status: AC
  Filled 2019-09-16: qty 100

## 2019-09-16 MED ORDER — HEPARIN (PORCINE) IN NACL 1000-0.9 UT/500ML-% IV SOLN
INTRAVENOUS | Status: AC
  Filled 2019-09-16: qty 500

## 2019-09-16 MED ORDER — HEPARIN (PORCINE) IN NACL 1000-0.9 UT/500ML-% IV SOLN
INTRAVENOUS | Status: DC | PRN
  Administered 2019-09-16 (×2): 500 mL

## 2019-09-16 MED ORDER — BUPIVACAINE HCL (PF) 0.25 % IJ SOLN
INTRAMUSCULAR | Status: DC | PRN
  Administered 2019-09-16: 30 mL

## 2019-09-16 MED ORDER — SODIUM CHLORIDE 0.9 % IV SOLN: 80.0000 mg | INTRAVENOUS | Status: DC

## 2019-09-16 MED ORDER — HEPARIN SODIUM (PORCINE) 1000 UNIT/ML IJ SOLN
INTRAMUSCULAR | Status: AC
  Filled 2019-09-16: qty 1

## 2019-09-16 MED ORDER — TAMSULOSIN HCL 0.4 MG PO CAPS
0.4000 mg | ORAL_CAPSULE | Freq: Two times a day (BID) | ORAL | Status: DC
  Administered 2019-09-16: 0.4 mg via ORAL
  Filled 2019-09-16: qty 1

## 2019-09-16 MED ORDER — ENOXAPARIN SODIUM 40 MG/0.4ML ~~LOC~~ SOLN: 40.0000 mg | SUBCUTANEOUS | Status: DC

## 2019-09-16 MED ORDER — SODIUM CHLORIDE 0.9% FLUSH: 3.0000 mL | INTRAVENOUS | Status: DC | PRN

## 2019-09-16 MED ORDER — BUPIVACAINE HCL (PF) 0.25 % IJ SOLN
INTRAMUSCULAR | Status: AC
  Filled 2019-09-16: qty 30

## 2019-09-16 MED ORDER — ONDANSETRON HCL 4 MG/2ML IJ SOLN: 4.0000 mg | Freq: Four times a day (QID) | INTRAMUSCULAR | Status: DC | PRN

## 2019-09-16 MED ORDER — SODIUM CHLORIDE 0.9 % IV SOLN: 250.0000 mL | INTRAVENOUS | Status: DC | PRN

## 2019-09-16 MED ORDER — MIDAZOLAM HCL 5 MG/5ML IJ SOLN
INTRAMUSCULAR | Status: DC | PRN
  Administered 2019-09-16: 2 mg via INTRAVENOUS
  Administered 2019-09-16: 1 mg via INTRAVENOUS

## 2019-09-16 MED ORDER — FINASTERIDE 5 MG PO TABS
5.0000 mg | ORAL_TABLET | Freq: Every evening | ORAL | Status: DC
  Administered 2019-09-16: 5 mg via ORAL
  Filled 2019-09-16: qty 1

## 2019-09-16 MED ORDER — SODIUM CHLORIDE 0.9% FLUSH
3.0000 mL | Freq: Two times a day (BID) | INTRAVENOUS | Status: DC
  Administered 2019-09-16: 3 mL via INTRAVENOUS

## 2019-09-16 MED ORDER — CEFAZOLIN SODIUM-DEXTROSE 2-4 GM/100ML-% IV SOLN
2.0000 g | INTRAVENOUS | Status: AC
  Administered 2019-09-16: 14:00:00 2 g via INTRAVENOUS

## 2019-09-16 MED ORDER — SODIUM CHLORIDE 0.9 % IV SOLN
INTRAVENOUS | Status: DC
  Administered 2019-09-16: 12:00:00 via INTRAVENOUS

## 2019-09-16 MED ORDER — ACETAMINOPHEN 325 MG PO TABS
650.0000 mg | ORAL_TABLET | ORAL | Status: DC | PRN
  Administered 2019-09-16: 650 mg via ORAL
  Filled 2019-09-16: qty 2

## 2019-09-16 SURGICAL SUPPLY — 13 items
CABLE SURGICAL S-101-97-12 (CABLE) ×3 IMPLANT
DEVICE CLOSURE PERCLS PRGLD 6F (VASCULAR PRODUCTS) ×3 IMPLANT
KIT DILATOR VASC 18G NDL (KITS) ×3 IMPLANT
MICRA AV TRANSCATH PACING SYS (Pacemaker) ×3 IMPLANT
MICRA INTRODUCER SHEATH (SHEATH) ×3
PAD PRO RADIOLUCENT 2001M-C (PAD) ×3 IMPLANT
PERCLOSE PROGLIDE 6F (VASCULAR PRODUCTS) ×9
SHEATH INTRODUCER MICRA (SHEATH) ×1 IMPLANT
SHEATH PINNACLE 8F 10CM (SHEATH) ×3 IMPLANT
SHEATH PROBE COVER 6X72 (BAG) ×3 IMPLANT
SYSTEM PACING TRNSCTH AV MICRA (Pacemaker) ×1 IMPLANT
TRAY PACEMAKER INSERTION (PACKS) ×3 IMPLANT
WIRE AMPLATZ SS-J .035X180CM (WIRE) ×3 IMPLANT

## 2019-09-16 NOTE — Consult Note (Addendum)
ELECTROPHYSIOLOGY CONSULT NOTE    Patient ID: Leonard Maldonado MRN: [id number], DOB/AGE: [birth year] 68 y.o.  Admit date: 09/16/2019 Date of Consult: 09/16/2019  Primary Physician: Wenda Low, MD Primary Cardiologist: No primary care provider on file.  Electrophysiologist: New   Referring Provider: Dr Paticia Stack  Patient Profile: Leonard Maldonado is a 68 y.o. male with a history of HTN, HLD, kidney stones, BPH, and chronic back pain who is being seen today for the evaluation of 2:1 AV block at the request of Dr. Paticia Stack.  HPI:  Leonard Maldonado is a 68 y.o. male with medical history as above. He presented to [facility] with DOE x 3-4 weeks especially while walking on an incline. HR usually 60-70s at home. EKG on admission showed 2:1 heart block (second degree) at 45 beats per minute. Pertinent labs included K 3.8, Cr 1.4, HS-trop 10 (negative), BNP 175, Hgb 14.1. CXR unremarkable.  He is feeling OK this am. He denies chest pain or orthpnea. Has had DOE for the past 3/4 weeks as above. Also c/o bendopnea.  His only history of syncope is "40 years ago giving blood". He had dizziness and lightheadedness yesterday, and checked his HR and in the 40s. He denies this symptoms prior to yesterday. His wife suggested he come to the ER.  He snores heavily per his wife, has never had sleep study.   No h/o syncope or near syncope.  He states he was bitten by a tick back in June/july. He was not symptomatic at that time. He had no rash or SOB associated.   He was previoulsly blood pressure medicine, but has not been for some years. He checks his BP (usually runs [id number] systolic) and HR ([id number]) regularly at home.   Past Medical History:  Diagnosis Date  . HTN (hypertension)   . Hyperlipemia      Surgical History:  Past Surgical History:  Procedure Laterality Date  . COLONOSCOPY WITH PROPOFOL N/A 11/04/2015   Procedure: COLONOSCOPY WITH PROPOFOL;  Surgeon: Garlan Fair, MD;  Location: [facility];   Service: Endoscopy;  Laterality: N/A;  . SHOULDER SURGERY       (Not in a hospital admission)   Inpatient Medications:  . enoxaparin (LOVENOX) injection  40 mg Subcutaneous Q24H    Allergies: No Known Allergies  Social History   Socioeconomic History  . Marital status: Married    Spouse name: Not on file  . Number of children: Not on file  . Years of education: Not on file  . Highest education level: Not on file  Occupational History  . Not on file  Social Needs  . Financial resource strain: Not on file  . Food insecurity    Worry: Not on file    Inability: Not on file  . Transportation needs    Medical: Not on file    Non-medical: Not on file  Tobacco Use  . Smoking status: Former Smoker    Packs/day: 1.00    Years: 10.00    Pack years: 10.00    Types: Cigarettes    Quit date: 03/18/1972    Years since quitting: 47.5  . Smokeless tobacco: Never Used  Substance and Sexual Activity  . Alcohol use: No    Alcohol/week: 0.0 standard drinks  . Drug use: No  . Sexual activity: Not on file  Lifestyle  . Physical activity    Days per week: Not on file    Minutes per session: Not on file  . Stress:  Not on file  Relationships  . Social Herbalist on phone: Not on file    Gets together: Not on file    Attends religious service: Not on file    Active member of club or organization: Not on file    Attends meetings of clubs or organizations: Not on file    Relationship status: Not on file  . Intimate partner violence    Fear of current or ex partner: Not on file    Emotionally abused: Not on file    Physically abused: Not on file    Forced sexual activity: Not on file  Other Topics Concern  . Not on file  Social History Narrative  . Not on file     Family History  Problem Relation Age of Onset  . Emphysema Father   . Lung cancer Father      Review of Systems: All other systems reviewed and are otherwise negative except as noted above.  Physical  Exam: Vitals:   09/16/19 0530 09/16/19 0545 09/16/19 0600 09/16/19 0615  BP: 137/76 129/78 139/82 135/76  Pulse: 68 71 64 66  Resp: 12 12 14 13   Temp:      TempSrc:      SpO2: 95% 95% 96% 96%  Weight:      Height:        GEN- The patient is well appearing, alert and oriented x 3 today.   HEENT: normocephalic, atraumatic; sclera clear, conjunctiva pink; hearing intact; oropharynx clear; neck supple Lungs- Clear to ausculation bilaterally, normal work of breathing.  No wheezes, rales, rhonchi Heart- Regular and bradycardic rhythm, no murmurs, rubs or gallops appreciated GI- soft, non-tender, non-distended, bowel sounds present Extremities- no clubbing, cyanosis, or edema; DP/PT/radial pulses 2+ bilaterally MS- no significant deformity or atrophy Skin- warm and dry, no rash or lesion Psych- euthymic mood, full affect Neuro- strength and sensation are intact  Labs:   Lab Results  Component Value Date   WBC 6.9 09/16/2019   HGB 14.1 09/16/2019   HCT 42.6 09/16/2019   MCV 89.9 09/16/2019   PLT 172 09/16/2019    Recent Labs  Lab 09/16/19 0120  NA 138  K 3.8  CL 105  CO2 22  BUN 16  CREATININE 1.40*  CALCIUM 8.7*  GLUCOSE 128*      Radiology/Studies: Dg Chest Portable 1 View  Result Date: 09/16/2019 CLINICAL DATA:  Shortness of breath EXAM: PORTABLE CHEST 1 VIEW COMPARISON:  03/08/2015 FINDINGS: The heart size is enlarged. Aortic calcifications are noted. There is no pneumothorax. No significant pleural effusion. No evidence for pulmonary edema. No acute osseous abnormality. IMPRESSION: 1. Cardiomegaly without edema or pleural effusion. 2. Aortic atherosclerosis. 3. No acute cardiopulmonary findings. Electronically Signed   By: Constance Holster M.D.   On: 09/16/2019 01:15    EKG: shows 2:1 AV block at 45 bpm (personally reviewed)  TELEMETRY: Intermittent 2:1 block in the 40s, with NSR 70s mostly overnight. (personally reviewed)   Assessment/Plan: 1.  Symptomatic  bradycardia - No AV nodal blockers on board - Reports distant history of cath (2002) that was "negative" - He does report a history of tick bite in the summer months. Was not tested or treated. - Stat Echo to assess LV function. If stable, with symptomatic 2nd degree AV block, meets criteria for PPM.  - With apparently stable sinus node function, would be a MicraAV candidate. Discussed risk and benefits of both traditional and leadless pacemaker implantation. Pt verbalizes understanding  and is very clear in his decision to proceed with pacemaker implantation pending further work up.   2. HTN - Stable off antihypertensives. He confirms he was NOT on any beta blocker of CCB.  ADDENDUM: Echo shows normal EF at 60-65% with no LVH and stable RV function.  Will discuss with MD.   For questions or updates, please contact [place] Please consult [email or web address] for contact info under Cardiology/STEMI.  Signed, Shirley Friar, PA-C 09/16/2019 7:20 AM   I have seen, examined the patient, and reviewed the above assessment and plan.  Changes to above are made where necessary.  On exam, RRR.  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  EF is preserved.  No reversible causes are found. I would therefore recommend pacemaker implantation at this time.  We discussed leadless and transvenous pacing systems. Risks, benefits, alternatives to pacemaker implantation (both approaches) were discussed in detail with the patient today. The patient understands that the risks include but are not limited to bleeding, infection, pneumothorax, perforation, tamponade, vascular damage, renal failure, MI, stroke, death,  and lead dislodgement and wishes to proceed.  He and his wife would prefer leadless pacing.  We will therefore plan to proceed later today.  Keep NPO in the interim.   Co Sign: [name], MD  09/16/2019 9:49 AM

## 2019-09-16 NOTE — H&P (View-Only) (Signed)
ELECTROPHYSIOLOGY CONSULT NOTE    Patient ID: Leonard Maldonado MRN: [id number], DOB/AGE: [birth year] 68 y.o.  Admit date: 09/16/2019 Date of Consult: 09/16/2019  Primary Physician: Wenda Low, MD Primary Cardiologist: No primary care provider on file.  Electrophysiologist: New   Referring Provider: Dr Paticia Stack  Patient Profile: Leonard Maldonado is a 68 y.o. male with a history of HTN, HLD, kidney stones, BPH, and chronic back pain who is being seen today for the evaluation of 2:1 AV block at the request of Dr. Paticia Stack.  HPI:  Leonard Maldonado is a 68 y.o. male with medical history as above. He presented to [facility] with DOE x 3-4 weeks especially while walking on an incline. HR usually 60-70s at home. EKG on admission showed 2:1 heart block (second degree) at 45 beats per minute. Pertinent labs included K 3.8, Cr 1.4, HS-trop 10 (negative), BNP 175, Hgb 14.1. CXR unremarkable.  He is feeling OK this am. He denies chest pain or orthpnea. Has had DOE for the past 3/4 weeks as above. Also c/o bendopnea.  His only history of syncope is "40 years ago giving blood". He had dizziness and lightheadedness yesterday, and checked his HR and in the 40s. He denies this symptoms prior to yesterday. His wife suggested he come to the ER.  He snores heavily per his wife, has never had sleep study.   No h/o syncope or near syncope.  He states he was bitten by a tick back in June/july. He was not symptomatic at that time. He had no rash or SOB associated.   He was previoulsly blood pressure medicine, but has not been for some years. He checks his BP (usually runs [id number] systolic) and HR ([id number]) regularly at home.   Past Medical History:  Diagnosis Date  . HTN (hypertension)   . Hyperlipemia      Surgical History:  Past Surgical History:  Procedure Laterality Date  . COLONOSCOPY WITH PROPOFOL N/A 11/04/2015   Procedure: COLONOSCOPY WITH PROPOFOL;  Surgeon: Garlan Fair, MD;  Location: [facility];   Service: Endoscopy;  Laterality: N/A;  . SHOULDER SURGERY       (Not in a hospital admission)   Inpatient Medications:  . enoxaparin (LOVENOX) injection  40 mg Subcutaneous Q24H    Allergies: No Known Allergies  Social History   Socioeconomic History  . Marital status: Married    Spouse name: Not on file  . Number of children: Not on file  . Years of education: Not on file  . Highest education level: Not on file  Occupational History  . Not on file  Social Needs  . Financial resource strain: Not on file  . Food insecurity    Worry: Not on file    Inability: Not on file  . Transportation needs    Medical: Not on file    Non-medical: Not on file  Tobacco Use  . Smoking status: Former Smoker    Packs/day: 1.00    Years: 10.00    Pack years: 10.00    Types: Cigarettes    Quit date: 03/18/1972    Years since quitting: 47.5  . Smokeless tobacco: Never Used  Substance and Sexual Activity  . Alcohol use: No    Alcohol/week: 0.0 standard drinks  . Drug use: No  . Sexual activity: Not on file  Lifestyle  . Physical activity    Days per week: Not on file    Minutes per session: Not on file  . Stress:  Not on file  Relationships  . Social Herbalist on phone: Not on file    Gets together: Not on file    Attends religious service: Not on file    Active member of club or organization: Not on file    Attends meetings of clubs or organizations: Not on file    Relationship status: Not on file  . Intimate partner violence    Fear of current or ex partner: Not on file    Emotionally abused: Not on file    Physically abused: Not on file    Forced sexual activity: Not on file  Other Topics Concern  . Not on file  Social History Narrative  . Not on file     Family History  Problem Relation Age of Onset  . Emphysema Father   . Lung cancer Father      Review of Systems: All other systems reviewed and are otherwise negative except as noted above.  Physical  Exam: Vitals:   09/16/19 0530 09/16/19 0545 09/16/19 0600 09/16/19 0615  BP: 137/76 129/78 139/82 135/76  Pulse: 68 71 64 66  Resp: 12 12 14 13   Temp:      TempSrc:      SpO2: 95% 95% 96% 96%  Weight:      Height:        GEN- The patient is well appearing, alert and oriented x 3 today.   HEENT: normocephalic, atraumatic; sclera clear, conjunctiva pink; hearing intact; oropharynx clear; neck supple Lungs- Clear to ausculation bilaterally, normal work of breathing.  No wheezes, rales, rhonchi Heart- Regular and bradycardic rhythm, no murmurs, rubs or gallops appreciated GI- soft, non-tender, non-distended, bowel sounds present Extremities- no clubbing, cyanosis, or edema; DP/PT/radial pulses 2+ bilaterally MS- no significant deformity or atrophy Skin- warm and dry, no rash or lesion Psych- euthymic mood, full affect Neuro- strength and sensation are intact  Labs:   Lab Results  Component Value Date   WBC 6.9 09/16/2019   HGB 14.1 09/16/2019   HCT 42.6 09/16/2019   MCV 89.9 09/16/2019   PLT 172 09/16/2019    Recent Labs  Lab 09/16/19 0120  NA 138  K 3.8  CL 105  CO2 22  BUN 16  CREATININE 1.40*  CALCIUM 8.7*  GLUCOSE 128*      Radiology/Studies: Dg Chest Portable 1 View  Result Date: 09/16/2019 CLINICAL DATA:  Shortness of breath EXAM: PORTABLE CHEST 1 VIEW COMPARISON:  03/08/2015 FINDINGS: The heart size is enlarged. Aortic calcifications are noted. There is no pneumothorax. No significant pleural effusion. No evidence for pulmonary edema. No acute osseous abnormality. IMPRESSION: 1. Cardiomegaly without edema or pleural effusion. 2. Aortic atherosclerosis. 3. No acute cardiopulmonary findings. Electronically Signed   By: Constance Holster M.D.   On: 09/16/2019 01:15    EKG: shows 2:1 AV block at 45 bpm (personally reviewed)  TELEMETRY: Intermittent 2:1 block in the 40s, with NSR 70s mostly overnight. (personally reviewed)   Assessment/Plan: 1.  Symptomatic  bradycardia - No AV nodal blockers on board - Reports distant history of cath (2002) that was "negative" - He does report a history of tick bite in the summer months. Was not tested or treated. - Stat Echo to assess LV function. If stable, with symptomatic 2nd degree AV block, meets criteria for PPM.  - With apparently stable sinus node function, would be a MicraAV candidate. Discussed risk and benefits of both traditional and leadless pacemaker implantation. Pt verbalizes understanding  and is very clear in his decision to proceed with pacemaker implantation pending further work up.   2. HTN - Stable off antihypertensives. He confirms he was NOT on any beta blocker of CCB.  ADDENDUM: Echo shows normal EF at 60-65% with no LVH and stable RV function.  Will discuss with MD.   For questions or updates, please contact [place] Please consult [email or web address] for contact info under Cardiology/STEMI.  Signed, Shirley Friar, PA-C 09/16/2019 7:20 AM   I have seen, examined the patient, and reviewed the above assessment and plan.  Changes to above are made where necessary.  On exam, RRR.  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  EF is preserved.  No reversible causes are found. I would therefore recommend pacemaker implantation at this time.  We discussed leadless and transvenous pacing systems. Risks, benefits, alternatives to pacemaker implantation (both approaches) were discussed in detail with the patient today. The patient understands that the risks include but are not limited to bleeding, infection, pneumothorax, perforation, tamponade, vascular damage, renal failure, MI, stroke, death,  and lead dislodgement and wishes to proceed.  He and his wife would prefer leadless pacing.  We will therefore plan to proceed later today.  Keep NPO in the interim.   Co Sign: [name], MD  09/16/2019 9:49 AM

## 2019-09-16 NOTE — Discharge Instructions (Signed)

## 2019-09-16 NOTE — H&P (Signed)
Cardiology Admission History and Physical:   Patient ID: Leonard Maldonado; MRN: [id number]; DOB: 1951/02/16   Admission date: 09/16/2019  Primary Care Provider: Wenda Low, MD Primary Cardiologist: No primary care provider on file.    Chief Complaint:  Bradycardia  History of Present Illness:   Leonard Maldonado is a 68 y.o. male with a history of hypertension, hyperlipidemia, kidney stones, BPH and back pain who presents to the hospital with complaints of exertional shortness of breath for the past 3 to 4 weeks.  He denies any prior history of cardiac disease.  He recalls having had a cardiac catheterization in 2002 here at [facility].  He states that the results of the test were negative.  He has a history of hypertension but is not currently on any antihypertensives.  He regularly checks his vital signs at home and his systolic blood pressure is usually in the 120s to 130s.  His heart rate is usually in the 60s to 70s.  For the past 3 weeks he has had shortness of breath especially when walking on an incline.  He also has dyspnea on moderate exertion.  He denies any chest pain.  There is no history of orthopnea, paroxysmal nocturnal dyspnea or lower extremity swelling.  Using a pulse ox at home the patient noticed that his heart rate today was in the 40s.  He therefore presented to the hospital for further work-up.  The patient had a tick bite in the summer months.  He does not have a history of Lyme's disease.  He admits that he has not been tested for this.  In the emergency department his heart rate was in the 40s.  The ECG revealed a 2:1 AV block (second-degree heart block).  The blood pressure was 149/71 mmHg.  The lab work revealed: Potassium 3.8, serum creatinine 1.4, high-sensitivity troponin VIII, BNP 175, hemoglobin 14.1, hematocrit 42.6 and platelets 172.  The chest x-ray was unremarkable.   Past Medical History:  Diagnosis Date  . HTN (hypertension)   . Hyperlipemia     Past Surgical History:  Procedure Laterality Date  . COLONOSCOPY WITH PROPOFOL N/A 11/04/2015   Procedure: COLONOSCOPY WITH PROPOFOL;  Surgeon: Garlan Fair, MD;  Location: [facility];  Service: Endoscopy;  Laterality: N/A;  . SHOULDER SURGERY       Medications Prior to Admission: Prior to Admission medications   Medication Sig Start Date End Date Taking? Authorizing Provider  aspirin 81 MG tablet Take 81 mg by mouth 2 (two) times a week.    Yes [provider]  cholecalciferol (VITAMIN D) 1000 UNITS tablet Take 1,000 Units by mouth daily.   Yes [provider]  COENZYME Q-10 PO Take 1 tablet by mouth daily.   Yes [provider]  diclofenac sodium (VOLTAREN) 1 % GEL Apply 2 g topically 4 (four) times daily.   Yes [provider]  finasteride (PROSCAR) 5 MG tablet Take 5 mg by mouth every evening.  08/05/18  Yes [provider]  latanoprost (XALATAN) 0.005 % ophthalmic solution Place 1 drop into both eyes at bedtime. 10/15/18  Yes [provider]  Magnesium 250 MG TABS Take 250 mg by mouth 2 (two) times a week.   Yes [provider]  meloxicam (MOBIC) 15 MG tablet Take 1 tablet (15 mg total) by mouth daily. Patient taking differently: Take 15 mg by mouth daily as needed for pain.  01/27/19  Yes Hyatt, Max T, DPM  Multiple Vitamin (MULTIVITAMIN)  tablet Take 1 tablet by mouth daily.   Yes [provider]  Omega-3 Fatty Acids (FISH OIL) 1200 MG CAPS Take 1,200 mg by mouth daily.    Yes [provider]  omeprazole (PRILOSEC) 40 MG capsule Take 40 mg by mouth 2 (two) times daily with a meal.  03/01/15  Yes [provider]  Probiotic Product (PROBIOTIC DAILY PO) Take 1 capsule by mouth 2 (two) times a week.    Yes [provider]  sildenafil (REVATIO) 20 MG tablet Take 20-40 mg by mouth as needed (for ED).  08/30/19  Yes [provider]  simvastatin (ZOCOR) 20 MG tablet Take 20 mg by mouth  at bedtime.    Yes [provider]  tamsulosin (FLOMAX) 0.4 MG CAPS capsule Take 0.4 mg by mouth 2 (two) times daily.  08/27/18  Yes [provider]     Allergies:   No Known Allergies  Social History:   Social History   Socioeconomic History  . Marital status: Married    Spouse name: Not on file  . Number of children: Not on file  . Years of education: Not on file  . Highest education level: Not on file  Occupational History  . Not on file  Social Needs  . Financial resource strain: Not on file  . Food insecurity    Worry: Not on file    Inability: Not on file  . Transportation needs    Medical: Not on file    Non-medical: Not on file  Tobacco Use  . Smoking status: Former Smoker    Packs/day: 1.00    Years: 10.00    Pack years: 10.00    Types: Cigarettes    Quit date: 03/18/1972    Years since quitting: 47.5  . Smokeless tobacco: Never Used  Substance and Sexual Activity  . Alcohol use: No    Alcohol/week: 0.0 standard drinks  . Drug use: No  . Sexual activity: Not on file  Lifestyle  . Physical activity    Days per week: Not on file    Minutes per session: Not on file  . Stress: Not on file  Relationships  . Social Herbalist on phone: Not on file    Gets together: Not on file    Attends religious service: Not on file    Active member of club or organization: Not on file    Attends meetings of clubs or organizations: Not on file    Relationship status: Not on file  . Intimate partner violence    Fear of current or ex partner: Not on file    Emotionally abused: Not on file    Physically abused: Not on file    Forced sexual activity: Not on file  Other Topics Concern  . Not on file  Social History Narrative  . Not on file     Family History:    The patient's family history includes Emphysema in his father; Lung cancer in his father.     Review of Systems: [y] = yes, [ ]  = no   . General: Weight gain [ ] ; Weight loss [ ] ;  Anorexia [ ] ; Fatigue [ ] ; Fever [ ] ; Chills [ ] ; Weakness [ ]   . Cardiac: Chest pain/pressure [ ] ; Resting SOB [ ] ; Exertional SOB [Y]; Orthopnea [ ] ; Pedal Edema [ ] ; Palpitations [ ] ; Syncope [ ] ; Presyncope [ ] ; Paroxysmal nocturnal dyspnea[ ]   . Pulmonary: Cough [ ] ; Wheezing[ ] ;  Hemoptysis[ ] ; Sputum [ ] ; Snoring [ ]   . GI: Vomiting[ ] ; Dysphagia[ ] ; Melena[ ] ; Hematochezia [ ] ; Heartburn[ ] ; Abdominal pain [ ] ; Constipation [ ] ; Diarrhea [ ] ; BRBPR [ ]   . GU: Hematuria[ ] ; Dysuria [ ] ; Nocturia[ ]   . Vascular: Pain in legs with walking [ ] ; Pain in feet with lying flat [ ] ; Non-healing sores [ ] ; Stroke [ ] ; TIA [ ] ; Slurred speech [ ] ;  . Neuro: Headaches[ ] ; Vertigo[ ] ; Seizures[ ] ; Paresthesias[ ] ;Blurred vision [ ] ; Diplopia [ ] ; Vision changes [ ]   . Ortho/Skin: Arthritis [ ] ; Joint pain [ ] ; Muscle pain [ ] ; Joint swelling [ ] ; Back Pain [ ] ; Rash [ ]   . Psych: Depression[ ] ; Anxiety[ ]   . Heme: Bleeding problems [ ] ; Clotting disorders [ ] ; Anemia [ ]   . Endocrine: Diabetes [ ] ; Thyroid dysfunction[ ]      Physical Exam/Data:   Vitals:   09/16/19 0033 09/16/19 0035 09/16/19 0045 09/16/19 0100  BP: (!) 151/78  (!) 149/71 (!) 147/66  Pulse: (!) 43  (!) 41 (!) 40  Resp: 16  16 14   Temp: 98.2 F (36.8 C)     TempSrc: Oral     SpO2: 96%  96% 96%  Weight:  100.7 kg    Height:  6\' 1"  (1.854 m)     No intake or output data in the 24 hours ending 09/16/19 0227 Filed Weights   09/16/19 0035  Weight: 100.7 kg   Body mass index is 29.29 kg/m.  General:  Well nourished, well developed, in no acute distress  HEENT: normal Lymph: no adenopathy Neck: no JVD Endocrine:  No thryomegaly Vascular: No carotid bruits; FA pulses 2+ bilaterally without bruits  Cardiac:  normal S1, S2; bradycardia; no murmur  Lungs:  clear to auscultation bilaterally, no wheezing, rhonchi or rales  Abd: soft, nontender, no hepatomegaly  Ext: no edema Musculoskeletal:  No deformities, BUE and BLE  strength normal and equal Skin: warm and dry  Neuro:  CNs 2-12 intact, no focal abnormalities noted Psych:  Normal affect    Laboratory Data:  Chemistry Recent Labs  Lab 09/16/19 0120  NA 138  K 3.8  CL 105  CO2 22  GLUCOSE 128*  BUN 16  CREATININE 1.40*  CALCIUM 8.7*  GFRNONAA 51*  GFRAA 59*  ANIONGAP 11    No results for input(s): PROT, ALBUMIN, AST, ALT, ALKPHOS, BILITOT in the last 168 hours. Hematology Recent Labs  Lab 09/16/19 0120  WBC 6.9  RBC 4.74  HGB 14.1  HCT 42.6  MCV 89.9  MCH 29.7  MCHC 33.1  RDW 12.9  PLT 172   Cardiac EnzymesNo results for input(s): TROPONINI in the last 168 hours. No results for input(s): TROPIPOC in the last 168 hours.  BNP Recent Labs  Lab 09/16/19 0054  BNP 175.1*    DDimer No results for input(s): DDIMER in the last 168 hours.  Radiology/Studies:  Dg Chest Portable 1 View  Result Date: 09/16/2019 CLINICAL DATA:  Shortness of breath EXAM: PORTABLE CHEST 1 VIEW COMPARISON:  03/08/2015 FINDINGS: The heart size is enlarged. Aortic calcifications are noted. There is no pneumothorax. No significant pleural effusion. No evidence for pulmonary edema. No acute osseous abnormality. IMPRESSION: 1. Cardiomegaly without edema or pleural effusion. 2. Aortic atherosclerosis. 3. No acute cardiopulmonary findings. Electronically Signed   By: Constance Holster M.D.   On: 09/16/2019 01:15    Assessment and Plan:   1. Symptomatic bradycardia (  type II second-degree heart block)  -Admit to telemetry -Pacer pads on chest, atropine at the bedside -Consider IV dopamine if the patient is persistently bradycardic -Keep n.p.o. -Check TSH -Avoid any AV nodal blockers -Electrophysiology consult in the morning -Transthoracic echocardiogram to evaluate LV function -Trend cardiac biomarkers, serial ECGs    Severity of Illness: The appropriate patient status for this patient is INPATIENT. Inpatient status is judged to be reasonable and  necessary in order to provide the required intensity of service to ensure the patient's safety. The patient's presenting symptoms, physical exam findings, and initial radiographic and laboratory data in the context of their chronic comorbidities is felt to place them at high risk for further clinical deterioration. Furthermore, it is not anticipated that the patient will be medically stable for discharge from the hospital within 2 midnights of admission. The following factors support the patient status of inpatient.   " The patient's presenting symptoms include exertional shortness of breath. " The worrisome physical exam findings include bradycardia. " The initial radiographic and laboratory data are worrisome because of second-degree heart block on ECG. " The chronic co-morbidities include hypertension and hyperlipidemia.   * I certify that at the point of admission it is my clinical judgment that the patient will require inpatient hospital care spanning beyond 2 midnights from the point of admission due to high intensity of service, high risk for further deterioration and high frequency of surveillance required.*    For questions or updates, please contact [place] Please consult [email or web address] for contact info under Cardiology/STEMI.    Signed, Meade Maw, MD  09/16/2019 2:27 AM

## 2019-09-16 NOTE — ED Provider Notes (Signed)
[facility]ERGENCY DEPARTMENT Provider Note   CSN: [id number] Arrival date & time: 09/16/19  0017     History   Chief Complaint Chief Complaint  Patient presents with  . Chest Pain    HR 40s    HPI Leonard Maldonado is a 68 y.o. male.     HPI  This is a 68 year old male with a history of hypertension and hyperlipidemia who presents with shortness of breath and low heart rate.  Patient reports over the last 2 to 3 weeks he has noted shortness of breath mostly with exertion.  He states he sometimes has difficulty walking up a hill.  Has not had any chest pain.  He states that he normally checks his heart rate once a month to record it for his primary physician.  He started taking his heart rate tonight around 6 PM and noted it to be in the low 40s.  This concerned him.  He is not having any chest pain at that time or shortness of breath.  Denies any recent fevers or cough.  No known sick exposures.  He is not currently on any blood pressure medications, beta-blockers or calcium channel blockers.  Past Medical History:  Diagnosis Date  . HTN (hypertension)   . Hyperlipemia     Patient Active Problem List   Diagnosis Date Noted  . 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 Cough 03/19/2015    Past Surgical History:  Procedure Laterality Date  . COLONOSCOPY WITH PROPOFOL N/A 11/04/2015   Procedure: COLONOSCOPY WITH PROPOFOL;  Surgeon: Garlan Fair, MD;  Location: [facility];  Service: Endoscopy;  Laterality: N/A;  . SHOULDER SURGERY          Home Medications    Prior to Admission medications   Medication Sig Start Date End Date Taking? Authorizing Provider  aspirin 81 MG tablet Take 81 mg by mouth 2 (two) times a week.     [provider]  cholecalciferol (VITAMIN D) 1000 UNITS tablet Take 1,000 Units by mouth daily.    [provider]  COENZYME Q-10 PO Take 1 tablet by mouth daily.    [provider]  finasteride (PROSCAR) 5  MG tablet Take 5 mg by mouth every evening.  08/05/18   [provider]  latanoprost (XALATAN) 0.005 % ophthalmic solution Place 1 drop into both eyes at bedtime. 10/15/18   [provider]  Magnesium 250 MG TABS Take 250 mg by mouth 2 (two) times a week.    [provider]  meloxicam (MOBIC) 15 MG tablet Take 1 tablet (15 mg total) by mouth daily. 01/27/19   Hyatt, Max T, DPM  Multiple Vitamin (MULTIVITAMIN) tablet Take 1 tablet by mouth daily.    [provider]  Omega-3 Fatty Acids (FISH OIL) 1200 MG CAPS Take 1,200 mg by mouth daily.     [provider]  omeprazole (PRILOSEC) 40 MG capsule Take 40 mg by mouth 2 (two) times daily with a meal.  03/01/15   [provider]  Probiotic Product (PROBIOTIC DAILY PO) Take by mouth.    [provider]  simvastatin (ZOCOR) 20 MG tablet Take 20 mg by mouth at bedtime.     [provider]  tamsulosin (FLOMAX) 0.4 MG CAPS capsule Take 0.4 mg by mouth 2 (two) times daily.  08/27/18   [provider]    Family History Family History  Problem Relation Age of Onset  . Emphysema Father   . Lung  cancer Father     Social History Social History   Tobacco Use  . Smoking status: Former Smoker    Packs/day: 1.00    Years: 10.00    Pack years: 10.00    Types: Cigarettes    Quit date: 03/18/1972    Years since quitting: 47.5  . Smokeless tobacco: Never Used  Substance Use Topics  . Alcohol use: No    Alcohol/week: 0.0 standard drinks  . Drug use: No     Allergies   Patient has no known allergies.   Review of Systems Review of Systems  Constitutional: Negative for fever.  Respiratory: Positive for shortness of breath. Negative for cough and wheezing.   Cardiovascular: Negative for chest pain and leg swelling.  Gastrointestinal: Negative for abdominal pain, nausea and vomiting.  All other systems reviewed and are negative.    Physical Exam Updated Vital Signs BP  (!) 147/66   Pulse (!) 40   Temp 98.2 F (36.8 C) (Oral)   Resp 14   Ht 1.854 m (6\' 1" )   Wt 100.7 kg   SpO2 96%   BMI 29.29 kg/m   Physical Exam Vitals signs and nursing note reviewed.  Constitutional:      Appearance: He is well-developed. He is not ill-appearing.  HENT:     Head: Normocephalic and atraumatic.  Eyes:     Pupils: Pupils are equal, round, and reactive to light.  Neck:     Musculoskeletal: Neck supple.  Cardiovascular:     Rate and Rhythm: Regular rhythm. Bradycardia present.     Heart sounds: Normal heart sounds. No murmur.  Pulmonary:     Effort: Pulmonary effort is normal. No respiratory distress.     Breath sounds: Normal breath sounds. No wheezing.  Abdominal:     General: Bowel sounds are normal.     Palpations: Abdomen is soft.     Tenderness: There is no abdominal tenderness. There is no rebound.  Musculoskeletal:     Right lower leg: Edema present.     Left lower leg: Edema present.     Comments: Trace bilateral lower extremity edema  Lymphadenopathy:     Cervical: No cervical adenopathy.  Skin:    General: Skin is warm and dry.  Neurological:     Mental Status: He is alert and oriented to person, place, and time.  Psychiatric:        Mood and Affect: Mood normal.      ED Treatments / Results  Labs (all labs ordered are listed, but only abnormal results are displayed) Labs Reviewed  SARS CORONAVIRUS 2 BY RT PCR (HOSPITAL ORDER, [facility])  CBC WITH DIFFERENTIAL/PLATELET  BASIC METABOLIC PANEL  BRAIN NATRIURETIC PEPTIDE  TROPONIN I (HIGH SENSITIVITY)    EKG EKG Interpretation  Date/Time:  Tuesday September 16 2019 00:25:19 EST Ventricular Rate:  45 PR Interval:    QRS Duration: 113 QT Interval:  538 QTC Calculation: 466 R Axis:   -36 Text Interpretation: Predominant 2:1 AV block Second degree heart block Borderline IVCD with LAD Abnormal R-wave progression, early transition Confirmed by [name] Jew [phone]) on 09/16/2019 12:43:04 AM   Radiology Dg Chest Portable 1 View  Result Date: 09/16/2019 CLINICAL DATA:  Shortness of breath EXAM: PORTABLE CHEST 1 VIEW COMPARISON:  03/08/2015 FINDINGS: The heart size is enlarged. Aortic calcifications are noted. There is no pneumothorax. No significant pleural effusion. No evidence for pulmonary edema. No acute osseous abnormality. IMPRESSION: 1. Cardiomegaly without  edema or pleural effusion. 2. Aortic atherosclerosis. 3. No acute cardiopulmonary findings. Electronically Signed   By: Constance Holster M.D.   On: 09/16/2019 01:15    Procedures Procedures (including critical care time)  CRITICAL CARE Performed by: Merryl Hacker   Total critical care time: 31 minutes  Critical care time was exclusive of separately billable procedures and treating other patients.  Critical care was necessary to treat or prevent imminent or life-threatening deterioration.  Critical care was time spent personally by me on the following activities: development of treatment plan with patient and/or surrogate as well as nursing, discussions with consultants, evaluation of patient's response to treatment, examination of patient, obtaining history from patient or surrogate, ordering and performing treatments and interventions, ordering and review of laboratory studies, ordering and review of radiographic studies, pulse oximetry and re-evaluation of patient's condition.   Medications Ordered in ED Medications - No data to display   Initial Impression / Assessment and Plan / ED Course  I have reviewed the triage vital signs and the nursing notes.  Pertinent labs & imaging results that were available during my care of the patient were reviewed by me and considered in my medical decision making (see chart for details).        Patient presents with shortness of breath.  Noted to have low heart rate at home.  He is overall nontoxic-appearing and vital  signs are notable for pulse rate of 40.  Blood pressure is 147/66.  EKG is suspicious for Mobitz type II with a 2-1 AV block.  Patient was placed on the pacer pads.  He is currently asymptomatic.  Suspect his shortness of breath over the last several weeks may be related to this arrhythmia.  Lab work was obtained including CBC, BMP, BNP, troponin.  Chest x-ray shows cardiomegaly but no pulmonary edema.  I spoke with the cardiologist, Dr. Paticia Stack, who will assess the patient.  Final Clinical Impressions(s) / ED Diagnoses   Final diagnoses:  Mobitz type 2 second degree AV block  SOB (shortness of breath)    ED Discharge Orders    None       Dewitte Vannice, Barbette Hair, MD 09/16/19 [phone]

## 2019-09-16 NOTE — Discharge Summary (Addendum)
ELECTROPHYSIOLOGY PROCEDURE DISCHARGE SUMMARY    Patient ID: Leonard Maldonado,  MRN: [id number], DOB/AGE: [birth year] 68 y.o.  Admit date: 09/16/2019 Discharge date: 09/17/19   Primary Care Physician: Wenda Low, MD  Primary Cardiologist: No primary care provider on file.  Electrophysiologist: Dr. Rayann Heman  Primary Discharge Diagnosis:  Symptomatic bradycardia status post pacemaker implantation this admission  Secondary Discharge Diagnosis:  HLD HTN (off medications)  No Known Allergies   Procedures This Admission:  1.  Implantation of a Medtronic Micra AV Leadless PPM on 09/16/2019 by Dr. Rayann Heman.  The patient received a Medtronic model number [id number] leadless ppm. There were no immediate post procedure complications. 2.  CXR on 09/17/19  demonstrated no pneumothorax status post device implantation.   Brief HPI: Leonard Maldonado is a 68 y.o. male presented to [facility] 09/16/2019 in the setting of bradycardia. Was found to have symptomatic, 2:1 AV block without reversible cause identified. EP asked to see for PPM consideration. Past medical history includes above as well as BPH and chronic back pain.  The patient has had symptomatic bradycardia without reversible causes identified.  Risks, benefits, and alternatives to PPM implantation were reviewed with the patient who wished to proceed.   Hospital Course:  The patient was admitted and underwent implantation of a Medtronic Micra AV Leadless PPM with details as outlined above.  He was monitored on telemetry overnight which demonstrated NSR 60-70s with intermittent V pacing, with stable appearing A to V synchrony prior to changes made the am of 09/17/2019. After reprogramming, pt had poor A to V synchrony including occasional recurrent 2:1 AV block with  stable rates. Pt was re-programmed with assistance from Medtronic prior to discharge. AV delay shortened and pt tracked more appropriately with activity. R groin stable without hematoma or  bruit.  The device was interrogated and found to be functioning normally.  CXR was obtained and demonstrated stable device appearance with no acute cardio/pulmonary changes.  Wound care and restrictions were reviewed with the patient. No arm restrictions with leadless PPM. The patient was examined and considered stable for discharge to home.    Physical Exam: Vitals:   09/16/19 1640 09/16/19 1710 09/16/19 1810 09/17/19 0408  BP: (!) 142/72 138/75 (!) 148/91 (!) 151/86  Pulse: 76 82 89 66  Resp: 14 18 18 20   Temp:    98 F (36.7 C)  TempSrc:    Oral  SpO2: 97% 97% 96% 96%  Weight:    100 kg  Height:        GEN- The patient is well appearing, alert and oriented x 3 today.   HEENT: normocephalic, atraumatic; sclera clear, conjunctiva pink; hearing intact; oropharynx clear; neck supple, no JVP Lymph- no cervical lymphadenopathy Lungs- Clear to ausculation bilaterally, normal work of breathing.  No wheezes, rales, rhonchi Heart- Regular rate and rhythm, no murmurs, rubs or gallops, PMI not laterally displaced GI- soft, non-tender, non-distended, bowel sounds present, no hepatosplenomegaly Extremities- no clubbing, cyanosis, or edema; DP/PT/radial pulses 2+ bilaterally. R groin without ecchymosis, hematoma, or bruit. MS- no significant deformity or atrophy Skin- warm and dry, no rash or lesion Psych- euthymic mood, full affect Neuro- strength and sensation are intact   Labs:   Lab Results  Component Value Date   WBC 6.9 09/16/2019   HGB 14.1 09/16/2019   HCT 42.6 09/16/2019   MCV 89.9 09/16/2019   PLT 172 09/16/2019    Recent Labs  Lab 09/17/19 0319  NA 139  K 3.8  CL 107  CO2 23  BUN 16  CREATININE 1.33*  CALCIUM 8.8*  GLUCOSE 98    Discharge Medications:  Allergies as of 09/17/2019   No Known Allergies     Medication List    TAKE these medications   acetaminophen 325 MG tablet Commonly known as: TYLENOL Take 2 tablets (650 mg total) by mouth every 4 (four)  hours as needed for headache or mild pain.   aspirin 81 MG tablet Take 81 mg by mouth 2 (two) times a week.   cholecalciferol 1000 units tablet Commonly known as: VITAMIN D Take 1,000 Units by mouth daily.   COENZYME Q-10 PO Take 1 tablet by mouth daily.   diclofenac sodium 1 % Gel Commonly known as: VOLTAREN Apply 2 g topically 4 (four) times daily.   finasteride 5 MG tablet Commonly known as: PROSCAR Take 5 mg by mouth every evening.   Fish Oil 1200 MG Caps Take 1,200 mg by mouth daily.   latanoprost 0.005 % ophthalmic solution Commonly known as: XALATAN Place 1 drop into both eyes at bedtime.   Magnesium 250 MG Tabs Take 250 mg by mouth 2 (two) times a week.   meloxicam 15 MG tablet Commonly known as: MOBIC Take 1 tablet (15 mg total) by mouth daily. What changed:   when to take this  reasons to take this   multivitamin tablet Take 1 tablet by mouth daily.   omeprazole 40 MG capsule Commonly known as: PRILOSEC Take 40 mg by mouth 2 (two) times daily with a meal.   PROBIOTIC DAILY PO Take 1 capsule by mouth 2 (two) times a week.   sildenafil 20 MG tablet Commonly known as: REVATIO Take 20-40 mg by mouth as needed (for ED).   simvastatin 20 MG tablet Commonly known as: ZOCOR Take 20 mg by mouth at bedtime.   tamsulosin 0.4 MG Caps capsule Commonly known as: FLOMAX Take 0.4 mg by mouth 2 (two) times daily.       Disposition:   Follow-up Information    [name], MD Follow up on 12/22/2019.   Specialty: Cardiology Why: at 230 for 3 month pacemaker check Contact information: [address] [place] [address] [place] Follow up on 09/25/2019.   Why: at 3 pm for post pacemaker check Contact information: [place] [phone]          Duration of Discharge Encounter: Greater than 30 minutes  including physician time.  Signed, Shirley Friar, PA-C  09/17/2019 8:56 AM   I have seen, examined the patient, and reviewed the above assessment and plan.  Changes to above are made where necessary.  On exam, RRR.  Doing well s/p PPM implant.  Device interrogation is reviewed and normal.  Reprogrammed today to promote AV synchrony.  Co Sign: [name], MD

## 2019-09-16 NOTE — Progress Notes (Signed)
  Echocardiogram 2D Echocardiogram has been performed.  Darlina Sicilian M 09/16/2019, 8:13 AM

## 2019-09-16 NOTE — Interval H&P Note (Signed)
History and Physical Interval Note:  09/16/2019 9:51 AM  Leonard Maldonado  has presented today for surgery, with the diagnosis of heart block.  The various methods of treatment have been discussed with the patient and family. After consideration of risks, benefits and other options for treatment, the patient has consented to  Procedure(s): PACEMAKER LEADLESS INSERTION (N/A) as a surgical intervention.  The patient's history has been reviewed, patient examined, no change in status, stable for surgery.  I have reviewed the patient's chart and labs.  Questions were answered to the patient's satisfaction.     [name]

## 2019-09-16 NOTE — ED Triage Notes (Signed)
Pt also c/o 2/10 chest pressure w/o radiation

## 2019-09-16 NOTE — ED Triage Notes (Signed)
Pt called ems due to heart rate being 45 on home monitor. Pt also complains of short of breath for past 3 weeks getting worse tonight with lightheadedness tonight.

## 2019-09-17 ENCOUNTER — Inpatient Hospital Stay (HOSPITAL_COMMUNITY): Payer: Medicare Other

## 2019-09-17 ENCOUNTER — Encounter (HOSPITAL_COMMUNITY): Payer: Self-pay | Admitting: Internal Medicine

## 2019-09-17 LAB — BASIC METABOLIC PANEL
Anion gap: 9 (ref 5–15)
BUN: 16 mg/dL (ref 8–23)
CO2: 23 mmol/L (ref 22–32)
Calcium: 8.8 mg/dL — ABNORMAL LOW (ref 8.9–10.3)
Chloride: 107 mmol/L (ref 98–111)
Creatinine, Ser: 1.33 mg/dL — ABNORMAL HIGH (ref 0.61–1.24)
GFR calc Af Amer: >60 mL/min (ref >60)
GFR calc non Af Amer: 54 mL/min — ABNORMAL LOW (ref >60)
Glucose, Bld: 98 mg/dL (ref 70–99)
Potassium: 3.8 mmol/L (ref 3.5–5.1)
Sodium: 139 mmol/L (ref 135–145)

## 2019-09-17 MED ORDER — ACETAMINOPHEN 325 MG PO TABS: 650.0000 mg | ORAL_TABLET | ORAL | Status: DC | PRN

## 2019-09-17 NOTE — Progress Notes (Signed)
Pt stable, dc home with wife

## 2019-09-25 ENCOUNTER — Ambulatory Visit (INDEPENDENT_AMBULATORY_CARE_PROVIDER_SITE_OTHER): Payer: Medicare Other | Admitting: Student

## 2019-09-25 ENCOUNTER — Other Ambulatory Visit: Payer: Self-pay

## 2019-09-25 ENCOUNTER — Encounter: Payer: Self-pay | Admitting: Student

## 2019-09-25 DIAGNOSIS — I441 Atrioventricular block, second degree: Secondary | ICD-10-CM | POA: Diagnosis not present

## 2019-09-25 LAB — CUP PACEART INCLINIC DEVICE CHECK
Date Time Interrogation Session: 20201112162834
Implantable Pulse Generator Implant Date: 20201103

## 2019-09-25 NOTE — Progress Notes (Signed)
Wound check appointment. Leadless PPM. Groin stable without hematoma. Presenting AM-VP. AM-VP 79.7% of the time, VS only 16.6%, presumed 1:1.  Attempted to increase AV delays in hospital with increased 2:1 blocking. So no adjustment made today per MDT rep. Normal device function. Thresholds slightly increased at 1.25V @ 0.24  ms, but inappropriate to increase pulse width for battery longevity per MDT. Sensing and impedances consistent with implant measurements. Device programmed at appropriate safety margin. Histogram distribution appropriate for patient and level of activity. No mode switches or high ventricular rates noted. ROV in 3 months with Dr. Rayann Heman.   See attached report. Interrogation done with MDT rep present.

## 2019-12-03 ENCOUNTER — Ambulatory Visit: Payer: Medicare PPO | Attending: Internal Medicine

## 2019-12-03 DIAGNOSIS — Z23 Encounter for immunization: Secondary | ICD-10-CM | POA: Insufficient documentation

## 2019-12-03 NOTE — Progress Notes (Signed)
   Covid-19 Vaccination Clinic  Name:  Leonard Maldonado    MRN: [id number] DOB: Jul 26, 1951  12/03/2019  [name] was observed post Covid-19 immunization for 15 minutes without incidence. He was provided with Vaccine Information Sheet and instruction to access the V-Safe system.   [name] was instructed to call 911 with any severe reactions post vaccine: Marland Kitchen Difficulty breathing  . Swelling of your face and throat  . A fast heartbeat  . A bad rash all over your body  . Dizziness and weakness    Immunizations Administered    Name Date Dose VIS Date Route   Pfizer COVID-19 Vaccine 12/03/2019  4:16 PM 0.3 mL 10/24/2019 Intramuscular   Manufacturer: [place]   Lot: [id number]   [place]: [id number]

## 2019-12-09 ENCOUNTER — Ambulatory Visit: Payer: Medicare PPO

## 2019-12-17 ENCOUNTER — Ambulatory Visit (INDEPENDENT_AMBULATORY_CARE_PROVIDER_SITE_OTHER): Payer: Medicare PPO | Admitting: *Deleted

## 2019-12-17 DIAGNOSIS — I441 Atrioventricular block, second degree: Secondary | ICD-10-CM

## 2019-12-17 LAB — CUP PACEART REMOTE DEVICE CHECK
Battery Remaining Longevity: 96 mo
Battery Voltage: 3.08 V
Brady Statistic AS VP Percent: 73.97 %
Brady Statistic AS VS Percent: 1.43 %
Brady Statistic RV Percent Paced: 78.94 %
Date Time Interrogation Session: 20210203065000
Implantable Pulse Generator Implant Date: 20201103
Lead Channel Impedance Value: 550 Ohm
Lead Channel Pacing Threshold Amplitude: 0.5 V
Lead Channel Pacing Threshold Pulse Width: 0.24 ms
Lead Channel Sensing Intrinsic Amplitude: 4.838 mV
Lead Channel Setting Pacing Amplitude: 2 V
Lead Channel Setting Pacing Pulse Width: 0.24 ms
Lead Channel Setting Sensing Sensitivity: 2 mV

## 2019-12-18 ENCOUNTER — Ambulatory Visit: Payer: Medicare PPO

## 2019-12-18 NOTE — Progress Notes (Signed)
PPM Remote  

## 2019-12-22 ENCOUNTER — Encounter: Payer: Self-pay | Admitting: Internal Medicine

## 2019-12-22 ENCOUNTER — Ambulatory Visit: Payer: Medicare Other | Admitting: Internal Medicine

## 2019-12-22 ENCOUNTER — Other Ambulatory Visit: Payer: Self-pay

## 2019-12-22 VITALS — BP 110/68 | HR 97 | Ht 73.0 in | Wt 230.8 lb

## 2019-12-22 DIAGNOSIS — I10 Essential (primary) hypertension: Secondary | ICD-10-CM

## 2019-12-22 DIAGNOSIS — I441 Atrioventricular block, second degree: Secondary | ICD-10-CM | POA: Diagnosis not present

## 2019-12-22 DIAGNOSIS — Z950 Presence of cardiac pacemaker: Secondary | ICD-10-CM | POA: Diagnosis not present

## 2019-12-22 LAB — CUP PACEART INCLINIC DEVICE CHECK
Battery Remaining Longevity: 96 mo
Battery Voltage: 3.07 V
Brady Statistic AS VP Percent: 73.73 %
Brady Statistic AS VS Percent: 1.47 %
Brady Statistic RV Percent Paced: 78.74 %
Date Time Interrogation Session: 20210208171301
Implantable Pulse Generator Implant Date: 20201103
Lead Channel Impedance Value: 570 Ohm
Lead Channel Pacing Threshold Amplitude: 0.5 V
Lead Channel Pacing Threshold Pulse Width: 0.24 ms
Lead Channel Sensing Intrinsic Amplitude: 14.625 mV
Lead Channel Setting Pacing Amplitude: 2 V
Lead Channel Setting Pacing Pulse Width: 0.24 ms
Lead Channel Setting Sensing Sensitivity: 2 mV

## 2019-12-22 NOTE — Patient Instructions (Addendum)
Medication Instructions:  Your physician recommends that you continue on your current medications as directed. Please refer to the Current Medication list given to you today.  Labwork: None ordered.  Testing/Procedures: None ordered.  Follow-Up:  Your physician wants you to follow-up in: 6 months with Chanetta Marshall, NP.   You will receive a reminder letter in the mail two months in advance. If you don't receive a letter, please call our office to schedule the follow-up appointment.  Your physician wants you to follow-up in: one year with Dr. Rayann Heman.   You will receive a reminder letter in the mail two months in advance. If you don't receive a letter, please call our office to schedule the follow-up appointment.  Remote monitoring is used to monitor your Pacemaker from home. This monitoring reduces the number of office visits required to check your device to one time per year. It allows [place] to keep an eye on the functioning of your device to ensure it is working properly. You are scheduled for a device check from home on 03/16/2020. You may send your transmission at any time that day. If you have a wireless device, the transmission will be sent automatically. After your physician reviews your transmission, you will receive a postcard with your next transmission date.  Any Other Special Instructions Will Be Listed Below (If Applicable).  If you need a refill on your cardiac medications before your next appointment, please call your pharmacy.

## 2019-12-22 NOTE — Progress Notes (Signed)
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  Since his leadless pacemaker implant, the patient reports doing very well.  Today, he denies symptoms of palpitations, chest pain, shortness of breath,  lower extremity edema, dizziness, presyncope, or syncope.  The patient is otherwise without complaint today.   Past Medical History:  Diagnosis Date  . HTN (hypertension)   . Hyperlipemia   . Second degree AV block    Past Surgical History:  Procedure Laterality Date  . COLONOSCOPY WITH PROPOFOL N/A 11/04/2015   Procedure: COLONOSCOPY WITH PROPOFOL;  Surgeon: Garlan Fair, MD;  Location: [facility];  Service: Endoscopy;  Laterality: N/A;  . PACEMAKER LEADLESS INSERTION N/A 09/16/2019   Procedure: PACEMAKER LEADLESS INSERTION;  Surgeon: [name], MD;  Location: [facility];  Service: Cardiovascular;  Laterality: N/A;  . SHOULDER SURGERY      ROS- all systems are reviewed and negative except as per HPI above  Current Outpatient Medications  Medication Sig Dispense Refill  . acetaminophen (TYLENOL) 325 MG tablet Take 2 tablets (650 mg total) by mouth every 4 (four) hours as needed for headache or mild pain.    Marland Kitchen aspirin 81 MG tablet Take 81 mg by mouth 2 (two) times a week.     . cholecalciferol (VITAMIN D) 1000 UNITS tablet Take 1,000 Units by mouth daily.    Marland Kitchen COENZYME Q-10 PO Take 1 tablet by mouth daily.    . diclofenac sodium (VOLTAREN) 1 % GEL Apply 2 g topically 4 (four) times daily.    . finasteride (PROSCAR) 5 MG tablet Take 5 mg by mouth every evening.   3  . latanoprost (XALATAN) 0.005 % ophthalmic solution Place 1 drop into both eyes at bedtime.  3  . Magnesium 250 MG TABS Take 250 mg by mouth 2 (two) times a week.    . meloxicam (MOBIC) 15 MG tablet Take 1 tablet (15 mg total) by mouth daily. 30 tablet 3  . Multiple Vitamin (MULTIVITAMIN) tablet Take 1 tablet  by mouth daily.    . Multiple Vitamins-Minerals (EMERGEN-C VITAMIN C) PACK Take 1 Dose by mouth 2 (two) times a week.    . Omega-3 Fatty Acids (FISH OIL) 1200 MG CAPS Take 1,200 mg by mouth daily.     Marland Kitchen omeprazole (PRILOSEC) 40 MG capsule Take 40 mg by mouth 2 (two) times daily with a meal.     . Probiotic Product (PROBIOTIC DAILY PO) Take 1 capsule by mouth 2 (two) times a week.     . sildenafil (REVATIO) 20 MG tablet Take 20-40 mg by mouth as needed (for ED).     Marland Kitchen simvastatin (ZOCOR) 20 MG tablet Take 20 mg by mouth at bedtime.     . tamsulosin (FLOMAX) 0.4 MG CAPS capsule Take 0.4 mg by mouth 2 (two) times daily.   3   No current facility-administered medications for this visit.    Physical Exam: Vitals:   12/22/19 1443  BP: 110/68  Pulse: 97  SpO2: 97%  Weight: 230 lb 12.8 oz (104.7 kg)  Height: 6\' 1"  (1.854 m)    GEN- The patient is well appearing, alert and oriented x 3 today.   Head- normocephalic, atraumatic Eyes-  Sclera clear, conjunctiva pink Ears- hearing intact Oropharynx- clear Lungs- Clear to ausculation bilaterally, normal work of breathing Chest- pacemaker pocket is well healed Heart- Regular rate  and rhythm, no murmurs, rubs or gallops, PMI not laterally displaced GI- soft, NT, ND, + BS Extremities- no clubbing, cyanosis, or edema  Pacemaker interrogation- reviewed in detail today,  See PACEART report  ekg tracing ordered today is personally reviewed and shows sinus with RBBB  Assessment and Plan:  1. Symptomatic mobitz II second degree heart block Normal leadless (Micra) pacemaker function See Pace Art report No changes today he is not device dependant today  2. HTN Stable No change required today  3. HL Stable on zocor No change required today  Remotes Return to see EP NP in 6 months I will see in a year  [name] MD, [facility] 12/22/2019 2:56 PM

## 2019-12-26 ENCOUNTER — Ambulatory Visit: Payer: Medicare PPO

## 2019-12-26 ENCOUNTER — Ambulatory Visit: Payer: Medicare PPO | Attending: Internal Medicine

## 2019-12-26 DIAGNOSIS — Z23 Encounter for immunization: Secondary | ICD-10-CM

## 2019-12-26 NOTE — Progress Notes (Signed)
   Covid-19 Vaccination Clinic  Name:  Leonard Maldonado    MRN: [id number] DOB: 1950/12/03  12/26/2019  Mr. Brando was observed post Covid-19 immunization for 15 minutes without incidence. He was provided with Vaccine Information Sheet and instruction to access the V-Safe system.   Mr. Stvincent was instructed to call 911 with any severe reactions post vaccine: Marland Kitchen Difficulty breathing  . Swelling of your face and throat  . A fast heartbeat  . A bad rash all over your body  . Dizziness and weakness    Immunizations Administered    Name Date Dose VIS Date Route   Pfizer COVID-19 Vaccine 12/26/2019  2:11 PM 0.3 mL 10/24/2019 Intramuscular   Manufacturer: [place]   Lot: [id number]   [place]: [id number]

## 2020-01-19 DIAGNOSIS — H401131 Primary open-angle glaucoma, bilateral, mild stage: Secondary | ICD-10-CM | POA: Diagnosis not present

## 2020-03-16 ENCOUNTER — Ambulatory Visit (INDEPENDENT_AMBULATORY_CARE_PROVIDER_SITE_OTHER): Payer: Medicare PPO | Admitting: *Deleted

## 2020-03-16 DIAGNOSIS — I441 Atrioventricular block, second degree: Secondary | ICD-10-CM

## 2020-03-16 LAB — CUP PACEART REMOTE DEVICE CHECK
Battery Remaining Longevity: 96 mo
Battery Voltage: 3.04 V
Brady Statistic AS VP Percent: 78.97 %
Brady Statistic AS VS Percent: 1.13 %
Brady Statistic RV Percent Paced: 86.01 %
Date Time Interrogation Session: 20210504062300
Implantable Pulse Generator Implant Date: 20201103
Lead Channel Impedance Value: 530 Ohm
Lead Channel Pacing Threshold Amplitude: 0.5 V
Lead Channel Pacing Threshold Pulse Width: 0.24 ms
Lead Channel Sensing Intrinsic Amplitude: 11.7 mV
Lead Channel Setting Pacing Amplitude: 1 V
Lead Channel Setting Pacing Pulse Width: 0.24 ms
Lead Channel Setting Sensing Sensitivity: 2 mV

## 2020-03-17 NOTE — Progress Notes (Signed)
Remote pacemaker transmission.   

## 2020-03-21 IMAGING — DX DG CHEST 1V PORT
1 series · 1 of 1 positions shown · non-contrast
Comparison: 03/08/2015

CLINICAL DATA: Shortness of breath

EXAM:
PORTABLE CHEST 1 VIEW

[chest ap]
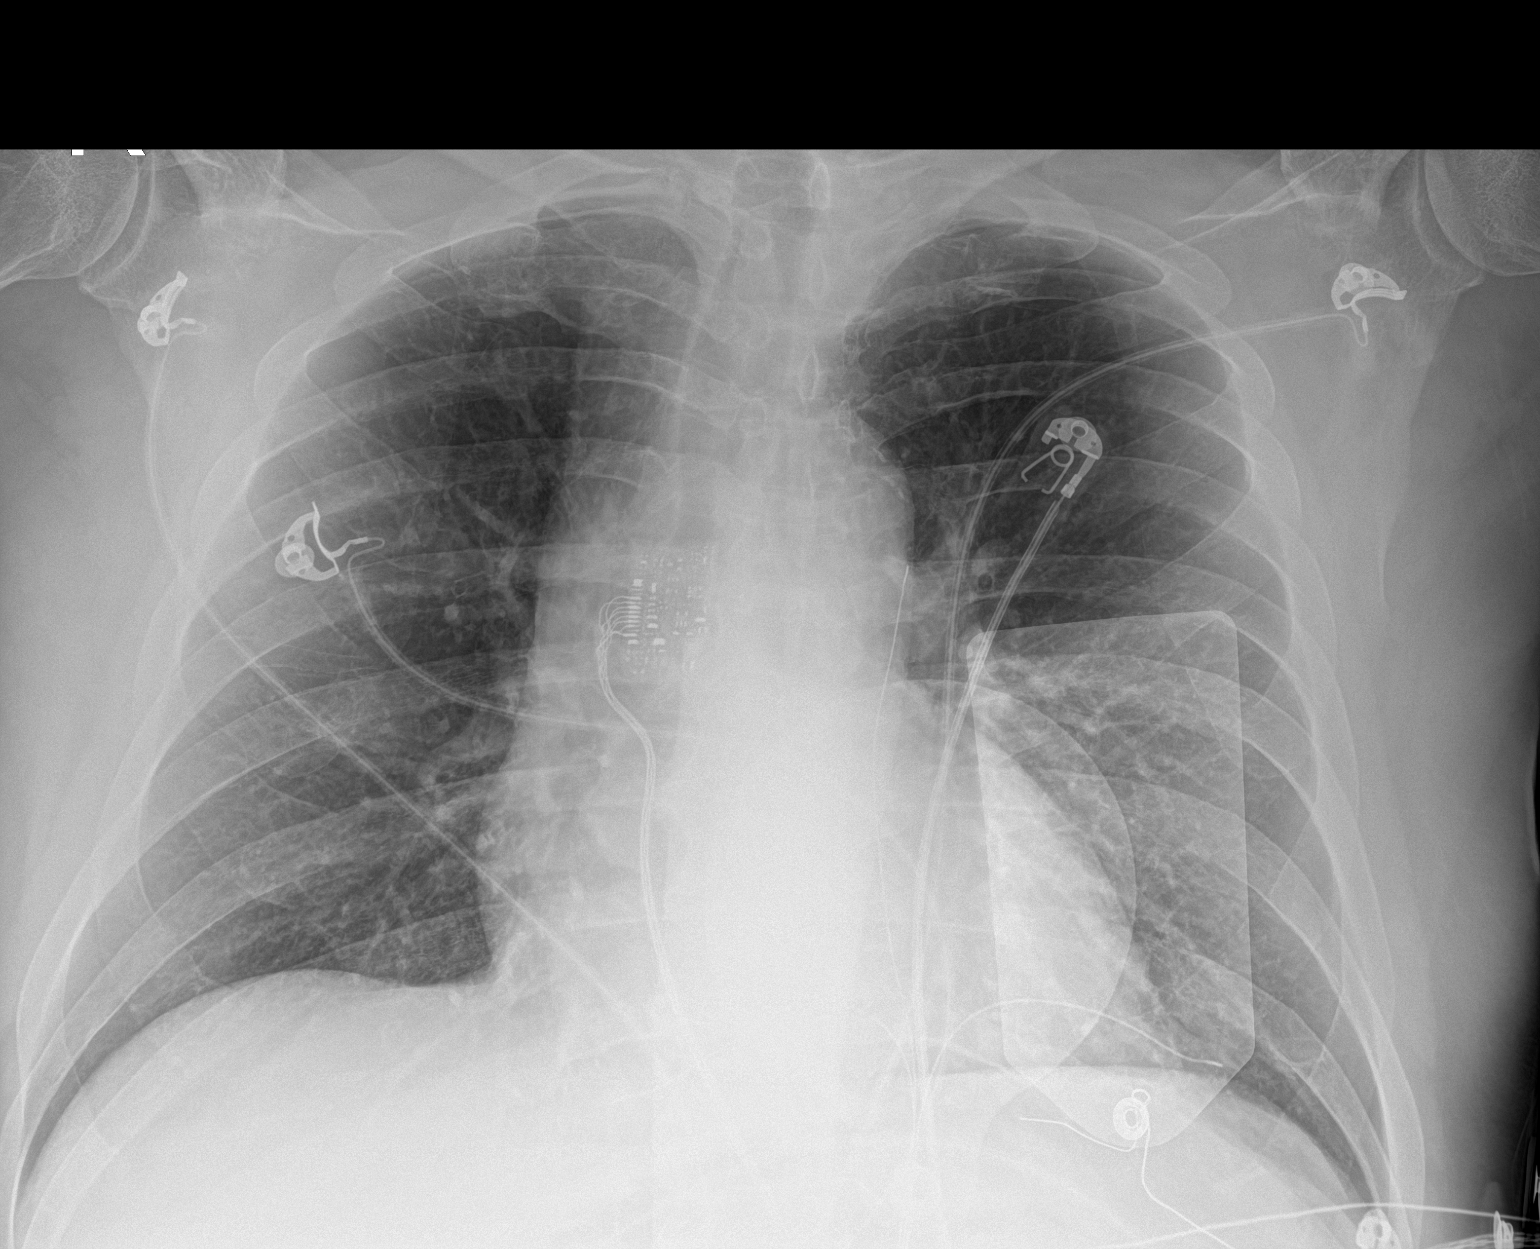

[1 of 1 positions shown; findings below may reference images not displayed]

FINDINGS: The heart size is enlarged. Aortic calcifications are noted. There
is no pneumothorax. No significant pleural effusion. No evidence for
pulmonary edema. No acute osseous abnormality.
IMPRESSION: 1. Cardiomegaly without edema or pleural effusion.
2. Aortic atherosclerosis.
3. No acute cardiopulmonary findings.

## 2020-04-20 DIAGNOSIS — H401131 Primary open-angle glaucoma, bilateral, mild stage: Secondary | ICD-10-CM | POA: Diagnosis not present

## 2020-05-18 DIAGNOSIS — R7309 Other abnormal glucose: Secondary | ICD-10-CM | POA: Diagnosis not present

## 2020-05-18 DIAGNOSIS — Z1389 Encounter for screening for other disorder: Secondary | ICD-10-CM | POA: Diagnosis not present

## 2020-05-18 DIAGNOSIS — I700 Atherosclerosis of aorta: Secondary | ICD-10-CM | POA: Diagnosis not present

## 2020-05-18 DIAGNOSIS — K219 Gastro-esophageal reflux disease without esophagitis: Secondary | ICD-10-CM | POA: Diagnosis not present

## 2020-05-18 DIAGNOSIS — Z0000 Encounter for general adult medical examination without abnormal findings: Secondary | ICD-10-CM | POA: Diagnosis not present

## 2020-05-18 DIAGNOSIS — N400 Enlarged prostate without lower urinary tract symptoms: Secondary | ICD-10-CM | POA: Diagnosis not present

## 2020-05-18 DIAGNOSIS — E7800 Pure hypercholesterolemia, unspecified: Secondary | ICD-10-CM | POA: Diagnosis not present

## 2020-05-18 DIAGNOSIS — Z950 Presence of cardiac pacemaker: Secondary | ICD-10-CM | POA: Diagnosis not present

## 2020-05-18 DIAGNOSIS — N183 Chronic kidney disease, stage 3 unspecified: Secondary | ICD-10-CM | POA: Diagnosis not present

## 2020-06-15 ENCOUNTER — Ambulatory Visit (INDEPENDENT_AMBULATORY_CARE_PROVIDER_SITE_OTHER): Payer: Medicare PPO | Admitting: *Deleted

## 2020-06-15 DIAGNOSIS — I441 Atrioventricular block, second degree: Secondary | ICD-10-CM

## 2020-06-15 LAB — CUP PACEART REMOTE DEVICE CHECK
Battery Remaining Longevity: 96 mo
Battery Voltage: 3.02 V
Brady Statistic AS VP Percent: 79.85 %
Brady Statistic AS VS Percent: 1.22 %
Brady Statistic RV Percent Paced: 86.47 %
Date Time Interrogation Session: 20210803064200
Implantable Pulse Generator Implant Date: 20201103
Lead Channel Impedance Value: 530 Ohm
Lead Channel Pacing Threshold Amplitude: 0.5 V
Lead Channel Pacing Threshold Pulse Width: 0.24 ms
Lead Channel Sensing Intrinsic Amplitude: 7.65 mV
Lead Channel Setting Pacing Amplitude: 1.125
Lead Channel Setting Pacing Pulse Width: 0.24 ms
Lead Channel Setting Sensing Sensitivity: 2 mV

## 2020-06-17 NOTE — Progress Notes (Signed)
Remote pacemaker transmission.   

## 2020-07-06 DIAGNOSIS — Z20822 Contact with and (suspected) exposure to covid-19: Secondary | ICD-10-CM | POA: Diagnosis not present

## 2020-07-20 DIAGNOSIS — L820 Inflamed seborrheic keratosis: Secondary | ICD-10-CM | POA: Diagnosis not present

## 2020-07-20 DIAGNOSIS — L570 Actinic keratosis: Secondary | ICD-10-CM | POA: Diagnosis not present

## 2020-07-20 DIAGNOSIS — L578 Other skin changes due to chronic exposure to nonionizing radiation: Secondary | ICD-10-CM | POA: Diagnosis not present

## 2020-07-21 DIAGNOSIS — H401131 Primary open-angle glaucoma, bilateral, mild stage: Secondary | ICD-10-CM | POA: Diagnosis not present

## 2020-07-28 NOTE — Progress Notes (Signed)
Electrophysiology Office Note Date: 07/29/2020  ID:  Leonard Maldonado, DOB Apr 15, 1951, MRN [id number]  PCP: Wenda Low, MD Electrophysiologist: Rayann Heman  CC: Pacemaker follow-up  Leonard Maldonado is a 69 y.o. male seen today for Dr Rayann Heman.  He presents today for routine electrophysiology followup.  Since last being seen in our clinic, the patient reports doing very well. He remains active working and staying active at home.  He had one isolated episode of shortness of breath earlier this week but no recurrence. He denies chest pain, palpitations, PND, orthopnea, nausea, vomiting, dizziness, syncope, edema, weight gain, or early satiety.  Device History: MDT Micra AV PPM implanted 2020 for Mobitz II    Past Medical History:  Diagnosis Date  . HTN (hypertension)   . Hyperlipemia   . Second degree AV block    Past Surgical History:  Procedure Laterality Date  . COLONOSCOPY WITH PROPOFOL N/A 11/04/2015   Procedure: COLONOSCOPY WITH PROPOFOL;  Surgeon: Garlan Fair, MD;  Location: [facility];  Service: Endoscopy;  Laterality: N/A;  . PACEMAKER LEADLESS INSERTION N/A 09/16/2019   Procedure: PACEMAKER LEADLESS INSERTION;  Surgeon: [name], MD;  Location: [facility];  Service: Cardiovascular;  Laterality: N/A;  . SHOULDER SURGERY      Current Outpatient Medications  Medication Sig Dispense Refill  . acetaminophen (TYLENOL) 325 MG tablet Take 2 tablets (650 mg total) by mouth every 4 (four) hours as needed for headache or mild pain.    Marland Kitchen aspirin 81 MG tablet Take 81 mg by mouth 2 (two) times a week.     . cholecalciferol (VITAMIN D) 1000 UNITS tablet Take 1,000 Units by mouth daily.    Marland Kitchen COENZYME Q-10 PO Take 1 tablet by mouth daily.    . diclofenac sodium (VOLTAREN) 1 % GEL Apply 2 g topically 4 (four) times daily.    . finasteride (PROSCAR) 5 MG tablet Take 5 mg by mouth every evening.   3  . latanoprost (XALATAN) 0.005 % ophthalmic solution Place 1 drop into both  eyes at bedtime.  3  . Magnesium 250 MG TABS Take 250 mg by mouth 2 (two) times a week.    . Multiple Vitamin (MULTIVITAMIN) tablet Take 1 tablet by mouth daily.    . Multiple Vitamins-Minerals (EMERGEN-C VITAMIN C) PACK Take 1 Dose by mouth 2 (two) times a week.    . Omega-3 Fatty Acids (FISH OIL) 1200 MG CAPS Take 1,200 mg by mouth daily.     Marland Kitchen omeprazole (PRILOSEC) 40 MG capsule Take 40 mg by mouth 2 (two) times daily with a meal.     . Probiotic Product (PROBIOTIC DAILY PO) Take 1 capsule by mouth 2 (two) times a week.     . sildenafil (REVATIO) 20 MG tablet Take 20-40 mg by mouth as needed (for ED).     Marland Kitchen simvastatin (ZOCOR) 20 MG tablet Take 20 mg by mouth at bedtime.     . tamsulosin (FLOMAX) 0.4 MG CAPS capsule Take 0.4 mg by mouth 2 (two) times daily.   3   No current facility-administered medications for this visit.    Allergies:   Patient has no known allergies.   Social History: Social History   Socioeconomic History  . Marital status: Married    Spouse name: Not on file  . Number of children: Not on file  . Years of education: Not on file  . Highest education level: Not on file  Occupational History  . Not  on file  Tobacco Use  . Smoking status: Former Smoker    Packs/day: 1.00    Years: 10.00    Pack years: 10.00    Types: Cigarettes    Quit date: 03/18/1972    Years since quitting: 48.3  . Smokeless tobacco: Never Used  Vaping Use  . Vaping Use: Never used  Substance and Sexual Activity  . Alcohol use: No    Alcohol/week: 0.0 standard drinks  . Drug use: No  . Sexual activity: Not on file  Other Topics Concern  . Not on file  Social History Narrative  . Not on file   Social Determinants of Health   Financial Resource Strain:   . Difficulty of Paying Living Expenses: Not on file  Food Insecurity:   . Worried About Charity fundraiser in the Last Year: Not on file  . Ran Out of Food in the Last Year: Not on file  Transportation Needs:   . Lack of  Transportation (Medical): Not on file  . Lack of Transportation (Non-Medical): Not on file  Physical Activity:   . Days of Exercise per Week: Not on file  . Minutes of Exercise per Session: Not on file  Stress:   . Feeling of Stress : Not on file  Social Connections:   . Frequency of Communication with Friends and Family: Not on file  . Frequency of Social Gatherings with Friends and Family: Not on file  . Attends Religious Services: Not on file  . Active Member of Clubs or Organizations: Not on file  . Attends Archivist Meetings: Not on file  . Marital Status: Not on file  Intimate Partner Violence:   . Fear of Current or Ex-Partner: Not on file  . Emotionally Abused: Not on file  . Physically Abused: Not on file  . Sexually Abused: Not on file    Family History: Family History  Problem Relation Age of Onset  . Emphysema Father   . Lung cancer Father      Review of Systems: All other systems reviewed and are otherwise negative except as noted above.   Physical Exam: VS:  BP 122/62   Pulse 80   Ht 6\' 1"  (1.854 m)   Wt 221 lb (100.2 kg)   SpO2 97%   BMI 29.16 kg/m  , BMI Body mass index is 29.16 kg/m.  GEN- The patient is well appearing, alert and oriented x 3 today.   HEENT: normocephalic, atraumatic; sclera clear, conjunctiva pink; hearing intact; oropharynx clear; neck supple  Lungs- Clear to ausculation bilaterally, normal work of breathing.  No wheezes, rales, rhonchi Heart- Regular rate and rhythm  GI- soft, non-tender, non-distended, bowel sounds present  Extremities- no clubbing, cyanosis, or edema  MS- no significant deformity or atrophy Skin- warm and dry, no rash or lesion  Psych- euthymic mood, full affect Neuro- strength and sensation are intact  PPM Interrogation- reviewed in detail today,  See PACEART report  EKG:  EKG is not ordered today.  Recent Labs: 09/16/2019: B Natriuretic Peptide 175.1; Hemoglobin 14.1; Platelets 172; TSH  4.702 09/17/2019: BUN 16; Creatinine, Ser 1.33; Potassium 3.8; Sodium 139   Wt Readings from Last 3 Encounters:  07/29/20 221 lb (100.2 kg)  12/22/19 230 lb 12.8 oz (104.7 kg)  09/17/19 220 lb 7.4 oz (100 kg)     Other studies Reviewed: Additional studies/ records that were reviewed today include: Dr Jackalyn Lombard office notes  Assessment and Plan:  1.  Symptomatic bradycardia  Normal PPM function (Micra AV) See Pace Art report No changes today  2.  HTN Stable No change required today   Current medicines are reviewed at length with the patient today.   The patient does not have concerns regarding his medicines.  The following changes were made today:  none  Labs/ tests ordered today include: none No orders of the defined types were placed in this encounter.    Disposition:   Follow up with [facility], Dr Rayann Heman 6 months    Signed, Chanetta Marshall, NP 07/29/2020 8:08 AM  [place] [address] [place] [address] [phone] (office) [phone] (fax)

## 2020-07-29 ENCOUNTER — Other Ambulatory Visit: Payer: Self-pay

## 2020-07-29 ENCOUNTER — Encounter: Payer: Self-pay | Admitting: Nurse Practitioner

## 2020-07-29 ENCOUNTER — Ambulatory Visit: Payer: Medicare PPO | Admitting: Nurse Practitioner

## 2020-07-29 VITALS — BP 122/62 | HR 80 | Ht 73.0 in | Wt 221.0 lb

## 2020-07-29 DIAGNOSIS — I10 Essential (primary) hypertension: Secondary | ICD-10-CM

## 2020-07-29 DIAGNOSIS — I441 Atrioventricular block, second degree: Secondary | ICD-10-CM

## 2020-07-29 LAB — CUP PACEART INCLINIC DEVICE CHECK
Battery Remaining Longevity: 96 mo
Battery Voltage: 3.02 V
Brady Statistic AS VP Percent: 79.59 %
Brady Statistic AS VS Percent: 1.25 %
Brady Statistic RV Percent Paced: 85.8 %
Date Time Interrogation Session: 20210916081908
Implantable Pulse Generator Implant Date: 20201103
Lead Channel Impedance Value: 520 Ohm
Lead Channel Pacing Threshold Amplitude: 0.5 V
Lead Channel Pacing Threshold Pulse Width: 0.24 ms
Lead Channel Sensing Intrinsic Amplitude: 7.2 mV
Lead Channel Setting Pacing Amplitude: 1 V
Lead Channel Setting Pacing Pulse Width: 0.24 ms
Lead Channel Setting Sensing Sensitivity: 2 mV

## 2020-07-29 NOTE — Patient Instructions (Addendum)
Medication Instructions:  *If you need a refill on your cardiac medications before your next appointment, please call your pharmacy*  Follow-Up: At [facility], you and your health needs are our priority.  As part of our continuing mission to provide you with exceptional heart care, we have created designated Provider Care Teams.  These Care Teams include your primary Cardiologist (physician) and Advanced Practice Providers (APPs -  Physician Assistants and Nurse Practitioners) who all work together to provide you with the care you need, when you need it.  We recommend signing up for the patient portal called "MyChart".  Sign up information is provided on this After Visit Summary.  MyChart is used to connect with patients for Virtual Visits (Telemedicine).  Patients are able to view lab/test results, encounter notes, upcoming appointments, etc.  Non-urgent messages can be sent to your provider as well.   To learn more about what you can do with MyChart, go to [email or web address].    Your next appointment:   Your physician recommends that you schedule a follow-up appointment in 6 MONTHS with Dr. Rayann Heman -- Wednesday 01/26/21 at 9:45 am.  Remote monitoring is used to monitor your Pacemaker from home. This monitoring reduces the number of office visits required to check your device to one time per year. It allows [place] to keep an eye on the functioning of your device to ensure it is working properly. You are scheduled for a device check from home on 09/14/20. You may send your transmission at any time that day. If you have a wireless device, the transmission will be sent automatically. After your physician reviews your transmission, you will receive a postcard with your next transmission date.  The format for your next appointment:   In Person with [name], MD

## 2020-08-13 DIAGNOSIS — R948 Abnormal results of function studies of other organs and systems: Secondary | ICD-10-CM | POA: Diagnosis not present

## 2020-09-06 ENCOUNTER — Encounter: Payer: Self-pay | Admitting: *Deleted

## 2020-09-06 NOTE — Telephone Encounter (Signed)
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

## 2020-09-08 DIAGNOSIS — R3915 Urgency of urination: Secondary | ICD-10-CM | POA: Diagnosis not present

## 2020-09-08 DIAGNOSIS — Z125 Encounter for screening for malignant neoplasm of prostate: Secondary | ICD-10-CM | POA: Diagnosis not present

## 2020-09-08 DIAGNOSIS — N528 Other male erectile dysfunction: Secondary | ICD-10-CM | POA: Diagnosis not present

## 2020-09-08 DIAGNOSIS — N200 Calculus of kidney: Secondary | ICD-10-CM | POA: Diagnosis not present

## 2020-09-08 DIAGNOSIS — N401 Enlarged prostate with lower urinary tract symptoms: Secondary | ICD-10-CM | POA: Diagnosis not present

## 2020-09-14 ENCOUNTER — Ambulatory Visit (INDEPENDENT_AMBULATORY_CARE_PROVIDER_SITE_OTHER): Payer: Medicare PPO

## 2020-09-14 DIAGNOSIS — I441 Atrioventricular block, second degree: Secondary | ICD-10-CM

## 2020-09-14 DIAGNOSIS — H903 Sensorineural hearing loss, bilateral: Secondary | ICD-10-CM | POA: Diagnosis not present

## 2020-09-15 LAB — CUP PACEART REMOTE DEVICE CHECK
Battery Remaining Longevity: 96 mo
Battery Voltage: 3.02 V
Brady Statistic AS VP Percent: 76.65 %
Brady Statistic AS VS Percent: 1.26 %
Brady Statistic RV Percent Paced: 81.71 %
Date Time Interrogation Session: 20211103053900
Implantable Pulse Generator Implant Date: 20201103
Lead Channel Impedance Value: 520 Ohm
Lead Channel Pacing Threshold Amplitude: 0.5 V
Lead Channel Pacing Threshold Pulse Width: 0.24 ms
Lead Channel Sensing Intrinsic Amplitude: 12.825 mV
Lead Channel Setting Pacing Amplitude: 1 V
Lead Channel Setting Pacing Pulse Width: 0.24 ms
Lead Channel Setting Sensing Sensitivity: 2 mV

## 2020-09-16 NOTE — Progress Notes (Signed)
Remote pacemaker transmission.   

## 2020-11-03 DIAGNOSIS — H401131 Primary open-angle glaucoma, bilateral, mild stage: Secondary | ICD-10-CM | POA: Diagnosis not present

## 2020-11-18 DIAGNOSIS — E7800 Pure hypercholesterolemia, unspecified: Secondary | ICD-10-CM | POA: Diagnosis not present

## 2020-11-18 DIAGNOSIS — I700 Atherosclerosis of aorta: Secondary | ICD-10-CM | POA: Diagnosis not present

## 2020-11-18 DIAGNOSIS — I10 Essential (primary) hypertension: Secondary | ICD-10-CM | POA: Diagnosis not present

## 2020-11-18 DIAGNOSIS — N183 Chronic kidney disease, stage 3 unspecified: Secondary | ICD-10-CM | POA: Diagnosis not present

## 2020-11-18 DIAGNOSIS — Z950 Presence of cardiac pacemaker: Secondary | ICD-10-CM | POA: Diagnosis not present

## 2020-11-18 DIAGNOSIS — K219 Gastro-esophageal reflux disease without esophagitis: Secondary | ICD-10-CM | POA: Diagnosis not present

## 2020-12-14 ENCOUNTER — Ambulatory Visit (INDEPENDENT_AMBULATORY_CARE_PROVIDER_SITE_OTHER): Payer: Medicare PPO

## 2020-12-14 DIAGNOSIS — I441 Atrioventricular block, second degree: Secondary | ICD-10-CM | POA: Diagnosis not present

## 2020-12-15 LAB — CUP PACEART REMOTE DEVICE CHECK
Battery Remaining Longevity: 96 mo
Battery Voltage: 3.01 V
Brady Statistic AS VP Percent: 77.36 %
Brady Statistic AS VS Percent: 1.12 %
Brady Statistic RV Percent Paced: 80.91 %
Date Time Interrogation Session: 20220202145000
Implantable Pulse Generator Implant Date: 20201103
Lead Channel Impedance Value: 510 Ohm
Lead Channel Pacing Threshold Amplitude: 0.5 V
Lead Channel Pacing Threshold Pulse Width: 0.24 ms
Lead Channel Sensing Intrinsic Amplitude: 11.925 mV
Lead Channel Setting Pacing Amplitude: 1 V
Lead Channel Setting Pacing Pulse Width: 0.24 ms
Lead Channel Setting Sensing Sensitivity: 2 mV

## 2020-12-22 NOTE — Progress Notes (Signed)
Remote pacemaker transmission.   

## 2021-01-26 ENCOUNTER — Ambulatory Visit: Payer: Medicare PPO | Admitting: Internal Medicine

## 2021-01-26 ENCOUNTER — Other Ambulatory Visit: Payer: Self-pay

## 2021-01-26 ENCOUNTER — Encounter: Payer: Self-pay | Admitting: *Deleted

## 2021-01-26 ENCOUNTER — Encounter: Payer: Self-pay | Admitting: Internal Medicine

## 2021-01-26 VITALS — BP 150/88 | HR 77 | Ht 73.0 in | Wt 226.4 lb

## 2021-01-26 DIAGNOSIS — I441 Atrioventricular block, second degree: Secondary | ICD-10-CM | POA: Diagnosis not present

## 2021-01-26 DIAGNOSIS — I10 Essential (primary) hypertension: Secondary | ICD-10-CM | POA: Diagnosis not present

## 2021-01-26 DIAGNOSIS — R0602 Shortness of breath: Secondary | ICD-10-CM

## 2021-01-26 DIAGNOSIS — E785 Hyperlipidemia, unspecified: Secondary | ICD-10-CM | POA: Diagnosis not present

## 2021-01-26 LAB — CUP PACEART INCLINIC DEVICE CHECK
Battery Remaining Longevity: 96 mo
Battery Voltage: 3.01 V
Brady Statistic AP VP Percent: 4.1 %
Brady Statistic AS VP Percent: 78.87 %
Brady Statistic AS VS Percent: 0.96 %
Brady Statistic RV Percent Paced: 82.95 %
Date Time Interrogation Session: 20220316095507
Implantable Pulse Generator Implant Date: 20201103
Lead Channel Impedance Value: 550 Ohm
Lead Channel Pacing Threshold Amplitude: 0.5 V
Lead Channel Pacing Threshold Pulse Width: 0.24 ms
Lead Channel Sensing Intrinsic Amplitude: 17 mV
Lead Channel Setting Pacing Amplitude: 1.125
Lead Channel Setting Pacing Pulse Width: 0.24 ms
Lead Channel Setting Sensing Sensitivity: 2 mV

## 2021-01-26 NOTE — Patient Instructions (Addendum)
Medication Instructions:  Your physician recommends that you continue on your current medications as directed. Please refer to the Current Medication list given to you today.  Labwork: None ordered.  Testing/Procedures: please schedule Echo & [place] has requested that you have an echocardiogram. Echocardiography is a painless test that uses sound waves to create images of your heart. It provides your doctor with information about the size and shape of your heart and how well your heart's chambers and valves are working. This procedure takes approximately one hour. There are no restrictions for this procedure. Your physician has requested that you have a lexiscan myoview. For further information please visit [email or web address]. Please follow instruction sheet, as given.  Follow-Up:  Your physician wants you to follow-up in: 6 months with   Legrand Como "Jonni Sanger" [place], PA-C   You will receive a reminder letter in the mail two months in advance. If you don't receive a letter, please call our office to schedule the follow-up appointment.  Any Other Special Instructions Will Be Listed Below (If Applicable).  If you need a refill on your cardiac medications before your next appointment, please call your pharmacy.    Low-Sodium Eating Plan (4 grams a day) Sodium, which is an element that makes up salt, helps you maintain a healthy balance of fluids in your body. Too much sodium can increase your blood pressure and cause fluid and waste to be held in your body. Your health care provider or dietitian may recommend following this plan if you have high blood pressure (hypertension), kidney disease, liver disease, or heart failure. Eating less sodium can help lower your blood pressure, reduce swelling, and protect your heart, liver, and kidneys. What are tips for following this plan? Reading food labels  The Nutrition Facts label lists the amount of sodium in one serving of the food. If  you eat more than one serving, you must multiply the listed amount of sodium by the number of servings.  Choose foods with less than 140 mg of sodium per serving.  Avoid foods with 300 mg of sodium or more per serving. Shopping  Look for lower-sodium products, often labeled as "low-sodium" or "no salt added."  Always check the sodium content, even if foods are labeled as "unsalted" or "no salt added."  Buy fresh foods. ? Avoid canned foods and pre-made or frozen meals. ? Avoid canned, cured, or processed meats.  Buy breads that have less than 80 mg of sodium per slice.   Cooking  Eat more home-cooked food and less restaurant, buffet, and fast food.  Avoid adding salt when cooking. Use salt-free seasonings or herbs instead of table salt or sea salt. Check with your health care provider or pharmacist before using salt substitutes.  Cook with plant-based oils, such as canola, sunflower, or olive oil.   Meal planning  When eating at a restaurant, ask that your food be prepared with less salt or no salt, if possible. Avoid dishes labeled as brined, pickled, cured, smoked, or made with soy sauce, miso, or teriyaki sauce.  Avoid foods that contain MSG (monosodium glutamate). MSG is sometimes added to [place] food, bouillon, and some canned foods.  Make meals that can be grilled, baked, poached, roasted, or steamed. These are generally made with less sodium. General information Most people on this plan should limit their sodium intake to 1,500-2,000 mg (milligrams) of sodium each day. What foods should I eat? Fruits Fresh, frozen, or canned fruit. Fruit juice. Vegetables Fresh or frozen vegetables. "  No salt added" canned vegetables. "No salt added" tomato sauce and paste. Low-sodium or reduced-sodium tomato and vegetable juice. Grains Low-sodium cereals, including oats, puffed wheat and rice, and shredded wheat. Low-sodium crackers. Unsalted rice. Unsalted pasta. Low-sodium bread.  Whole-grain breads and whole-grain pasta. Meats and other proteins Fresh or frozen (no salt added) meat, poultry, seafood, and fish. Low-sodium canned tuna and salmon. Unsalted nuts. Dried peas, beans, and lentils without added salt. Unsalted canned beans. Eggs. Unsalted nut butters. Dairy Milk. Soy milk. Cheese that is naturally low in sodium, such as ricotta cheese, fresh mozzarella, or Swiss cheese. Low-sodium or reduced-sodium cheese. Cream cheese. Yogurt. Seasonings and condiments Fresh and dried herbs and spices. Salt-free seasonings. Low-sodium mustard and ketchup. Sodium-free salad dressing. Sodium-free light mayonnaise. Fresh or refrigerated horseradish. Lemon juice. Vinegar. Other foods Homemade, reduced-sodium, or low-sodium soups. Unsalted popcorn and pretzels. Low-salt or salt-free chips. The items listed above may not be a complete list of foods and beverages you can eat. Contact a dietitian for more information. What foods should I avoid? Vegetables Sauerkraut, pickled vegetables, and relishes. Olives. [place] fries. Onion rings. Regular canned vegetables (not low-sodium or reduced-sodium). Regular canned tomato sauce and paste (not low-sodium or reduced-sodium). Regular tomato and vegetable juice (not low-sodium or reduced-sodium). Frozen vegetables in sauces. Grains Instant hot cereals. Bread stuffing, pancake, and biscuit mixes. Croutons. Seasoned rice or pasta mixes. Noodle soup cups. Boxed or frozen macaroni and cheese. Regular salted crackers. Self-rising flour. Meats and other proteins Meat or fish that is salted, canned, smoked, spiced, or pickled. Precooked or cured meat, such as sausages or meat loaves. Berniece Salines. Ham. Pepperoni. Hot dogs. Corned beef. Chipped beef. Salt pork. Jerky. Pickled herring. Anchovies and sardines. Regular canned tuna. Salted nuts. Dairy Processed cheese and cheese spreads. Hard cheeses. Cheese curds. Blue cheese. Feta cheese. String cheese. Regular  cottage cheese. Buttermilk. Canned milk. Fats and oils Salted butter. Regular margarine. Ghee. Bacon fat. Seasonings and condiments Onion salt, garlic salt, seasoned salt, table salt, and sea salt. Canned and packaged gravies. Worcestershire sauce. Tartar sauce. Barbecue sauce. Teriyaki sauce. Soy sauce, including reduced-sodium. Steak sauce. Fish sauce. Oyster sauce. Cocktail sauce. Horseradish that you find on the shelf. Regular ketchup and mustard. Meat flavorings and tenderizers. Bouillon cubes. Hot sauce. Pre-made or packaged marinades. Pre-made or packaged taco seasonings. Relishes. Regular salad dressings. Salsa. Other foods Salted popcorn and pretzels. Corn chips and puffs. Potato and tortilla chips. Canned or dried soups. Pizza. Frozen entrees and pot pies. The items listed above may not be a complete list of foods and beverages you should avoid. Contact a dietitian for more information. Summary  Eating less sodium can help lower your blood pressure, reduce swelling, and protect your heart, liver, and kidneys.  Most people on this plan should limit their sodium intake to 1,500-2,000 mg (milligrams) of sodium each day.  Canned, boxed, and frozen foods are high in sodium. Restaurant foods, fast foods, and pizza are also very high in sodium. You also get sodium by adding salt to food.  Try to cook at home, eat more fresh fruits and vegetables, and eat less fast food and canned, processed, or prepared foods. This information is not intended to replace advice given to you by your health care provider. Make sure you discuss any questions you have with your health care provider. Document Revised: 12/05/2019 Document Reviewed: 10/01/2019 Elsevier Patient Education  2021 [employer].

## 2021-01-26 NOTE — Progress Notes (Signed)
PCP: Leonard Low, MD   Primary EP:  Leonard Maldonado is a 70 y.o. male who presents today for routine electrophysiology followup.  Since last being seen in our clinic, the patient reports doing very well.  He does reports some SOB.  This occurs primarily when bending over.  Today, he denies symptoms of palpitations, chest pain,  lower extremity edema, dizziness, presyncope, or syncope.  The patient is otherwise without complaint today.   Past Medical History:  Diagnosis Date  . HTN (hypertension)   . Hyperlipemia   . Second degree AV block    Past Surgical History:  Procedure Laterality Date  . COLONOSCOPY WITH PROPOFOL N/A 11/04/2015   Procedure: COLONOSCOPY WITH PROPOFOL;  Surgeon: Garlan Fair, MD;  Location: [facility];  Service: Endoscopy;  Laterality: N/A;  . PACEMAKER LEADLESS INSERTION N/A 09/16/2019   Procedure: PACEMAKER LEADLESS INSERTION;  Surgeon: [name], MD;  Location: [facility];  Service: Cardiovascular;  Laterality: N/A;  . SHOULDER SURGERY      ROS- all systems are reviewed and negative except as per HPI above  Current Outpatient Medications  Medication Sig Dispense Refill  . acetaminophen (TYLENOL) 325 MG tablet Take 2 tablets (650 mg total) by mouth every 4 (four) hours as needed for headache or mild pain.    Marland Kitchen aspirin 81 MG tablet Take 81 mg by mouth 2 (two) times a week.     . cholecalciferol (VITAMIN D) 1000 UNITS tablet Take 1,000 Units by mouth daily.    Marland Kitchen COENZYME Q-10 PO Take 1 tablet by mouth daily.    . diclofenac sodium (VOLTAREN) 1 % GEL Apply 2 g topically 4 (four) times daily.    . finasteride (PROSCAR) 5 MG tablet Take 5 mg by mouth every evening.   3  . latanoprost (XALATAN) 0.005 % ophthalmic solution Place 1 drop into both eyes at bedtime.  3  . Magnesium 250 MG TABS Take 250 mg by mouth 2 (two) times a week.    . Multiple Vitamin (MULTIVITAMIN) tablet Take 1 tablet by mouth daily.    . Multiple  Vitamins-Minerals (EMERGEN-C VITAMIN C) PACK Take 1 Dose by mouth 2 (two) times a week.    . Omega-3 Fatty Acids (FISH OIL) 1200 MG CAPS Take 1,200 mg by mouth daily.     Marland Kitchen omeprazole (PRILOSEC) 40 MG capsule Take 40 mg by mouth 2 (two) times daily with a meal.     . Probiotic Product (PROBIOTIC DAILY PO) Take 1 capsule by mouth 2 (two) times a week.     . sildenafil (REVATIO) 20 MG tablet Take 20-40 mg by mouth as needed (for ED).     Marland Kitchen simvastatin (ZOCOR) 20 MG tablet Take 20 mg by mouth at bedtime.     . tamsulosin (FLOMAX) 0.4 MG CAPS capsule Take 0.4 mg by mouth 2 (two) times daily.   3   No current facility-administered medications for this visit.    Physical Exam: Vitals:   01/26/21 0943  BP: (!) 150/88  Pulse: 77  SpO2: 96%  Weight: 226 lb 6.4 oz (102.7 kg)  Height: 6\' 1"  (1.854 m)    GEN- The patient is well appearing, alert and oriented x 3 today.   Head- normocephalic, atraumatic Eyes-  Sclera clear, conjunctiva pink Ears- hearing intact Oropharynx- clear Lungs- Clear to ausculation bilaterally, normal work of breathing Heart- Regular rate and rhythm, no murmurs, rubs or gallops, PMI not laterally displaced GI-  soft, NT, ND, + BS Extremities- no clubbing, cyanosis, or edema  Pacemaker interrogation- reviewed in detail today,  See PACEART report  ekg tracing ordered today is personally reviewed and shows sinus with V pacing  Assessment and Plan:  1. Symptomatic second degree heart block Normal pacemaker function (micra AV) See Pace Art report No changes today he is not device dependant today We reviewed apple watch tracings today which were normal, but limited by artifact  2. HTN Elevated today He presents with a home journal which I have reviewed today which reveals much better BP control at home. No change required today 4 gram sodium diet advised He does not wish to start medicine  3. SOB Regular exercise encouraged Echo myoview   Risks, benefits  and potential toxicities for medications prescribed and/or refilled reviewed with patient today.   Return to see EP PA every 6 months  [name] MD, [facility] 01/26/2021 10:14 AM

## 2021-02-02 DIAGNOSIS — H401131 Primary open-angle glaucoma, bilateral, mild stage: Secondary | ICD-10-CM | POA: Diagnosis not present

## 2021-02-14 ENCOUNTER — Telehealth (HOSPITAL_COMMUNITY): Payer: Self-pay

## 2021-02-15 NOTE — Telephone Encounter (Signed)
Spoke with the patient's wife. Detailed instructions given. She stated that she would make sure he got the instructions and be here for his test. Asked to call back with any questions. S.Carlisia Geno EMTP

## 2021-02-17 ENCOUNTER — Other Ambulatory Visit: Payer: Self-pay

## 2021-02-17 ENCOUNTER — Ambulatory Visit (HOSPITAL_COMMUNITY): Payer: Medicare PPO | Attending: Cardiovascular Disease

## 2021-02-17 ENCOUNTER — Ambulatory Visit (HOSPITAL_BASED_OUTPATIENT_CLINIC_OR_DEPARTMENT_OTHER): Payer: Medicare PPO

## 2021-02-17 DIAGNOSIS — E785 Hyperlipidemia, unspecified: Secondary | ICD-10-CM | POA: Diagnosis not present

## 2021-02-17 DIAGNOSIS — I10 Essential (primary) hypertension: Secondary | ICD-10-CM

## 2021-02-17 DIAGNOSIS — R0602 Shortness of breath: Secondary | ICD-10-CM

## 2021-02-17 DIAGNOSIS — I441 Atrioventricular block, second degree: Secondary | ICD-10-CM | POA: Insufficient documentation

## 2021-02-17 LAB — ECHOCARDIOGRAM COMPLETE
Area-P 1/2: 2.66 cm2
Height: 73 in
P 1/2 time: 398 msec
S' Lateral: 3.3 cm
Weight: 3616 oz

## 2021-02-17 LAB — MYOCARDIAL PERFUSION IMAGING
LV dias vol: 97 mL (ref 62–150)
LV sys vol: 38 mL
Peak HR: 91 {beats}/min
Rest HR: 57 {beats}/min
SDS: 4
SRS: 2
SSS: 6
TID: 0.98

## 2021-02-17 MED ORDER — REGADENOSON 0.4 MG/5ML IV SOLN
0.4000 mg | Freq: Once | INTRAVENOUS | Status: AC
  Administered 2021-02-17: 0.4 mg via INTRAVENOUS

## 2021-02-17 MED ORDER — TECHNETIUM TC 99M TETROFOSMIN IV KIT
30.3000 | PACK | Freq: Once | INTRAVENOUS | Status: AC | PRN
  Administered 2021-02-17: 30.3 via INTRAVENOUS
  Filled 2021-02-17: qty 31

## 2021-02-17 MED ORDER — TECHNETIUM TC 99M TETROFOSMIN IV KIT
10.9000 | PACK | Freq: Once | INTRAVENOUS | Status: AC | PRN
  Administered 2021-02-17: 10.9 via INTRAVENOUS
  Filled 2021-02-17: qty 11

## 2021-02-22 ENCOUNTER — Telehealth: Payer: Self-pay | Admitting: Internal Medicine

## 2021-02-22 NOTE — Telephone Encounter (Signed)
Pt is returning Tina's phone for echo test results.Please advise

## 2021-02-22 NOTE — Telephone Encounter (Signed)
The patient has been notified of the echo and stress test result and verbalized understanding.  All questions (if any) were answered. Darrell Jewel, RN 02/22/2021 2:51 PM

## 2021-03-15 ENCOUNTER — Ambulatory Visit (INDEPENDENT_AMBULATORY_CARE_PROVIDER_SITE_OTHER): Payer: Medicare PPO

## 2021-03-15 DIAGNOSIS — I441 Atrioventricular block, second degree: Secondary | ICD-10-CM

## 2021-03-15 LAB — CUP PACEART REMOTE DEVICE CHECK
Battery Remaining Longevity: 92 mo
Battery Voltage: 3.01 V
Brady Statistic AS VP Percent: 85.76 %
Brady Statistic AS VS Percent: 0.19 %
Brady Statistic RV Percent Paced: 94.01 %
Date Time Interrogation Session: 20220503053000
Implantable Pulse Generator Implant Date: 20201103
Lead Channel Impedance Value: 520 Ohm
Lead Channel Pacing Threshold Amplitude: 0.5 V
Lead Channel Pacing Threshold Pulse Width: 0.24 ms
Lead Channel Sensing Intrinsic Amplitude: 11.7 mV
Lead Channel Setting Pacing Amplitude: 1.125
Lead Channel Setting Pacing Pulse Width: 0.24 ms
Lead Channel Setting Sensing Sensitivity: 2 mV

## 2021-04-06 NOTE — Progress Notes (Signed)
Remote pacemaker transmission.   

## 2021-04-25 DIAGNOSIS — H401131 Primary open-angle glaucoma, bilateral, mild stage: Secondary | ICD-10-CM | POA: Diagnosis not present

## 2021-05-25 DIAGNOSIS — Z950 Presence of cardiac pacemaker: Secondary | ICD-10-CM | POA: Diagnosis not present

## 2021-05-25 DIAGNOSIS — I700 Atherosclerosis of aorta: Secondary | ICD-10-CM | POA: Diagnosis not present

## 2021-05-25 DIAGNOSIS — N183 Chronic kidney disease, stage 3 unspecified: Secondary | ICD-10-CM | POA: Diagnosis not present

## 2021-05-25 DIAGNOSIS — Z1389 Encounter for screening for other disorder: Secondary | ICD-10-CM | POA: Diagnosis not present

## 2021-05-25 DIAGNOSIS — Z23 Encounter for immunization: Secondary | ICD-10-CM | POA: Diagnosis not present

## 2021-05-25 DIAGNOSIS — Z0000 Encounter for general adult medical examination without abnormal findings: Secondary | ICD-10-CM | POA: Diagnosis not present

## 2021-05-25 DIAGNOSIS — N400 Enlarged prostate without lower urinary tract symptoms: Secondary | ICD-10-CM | POA: Diagnosis not present

## 2021-05-25 DIAGNOSIS — E7800 Pure hypercholesterolemia, unspecified: Secondary | ICD-10-CM | POA: Diagnosis not present

## 2021-05-25 DIAGNOSIS — K219 Gastro-esophageal reflux disease without esophagitis: Secondary | ICD-10-CM | POA: Diagnosis not present

## 2021-05-25 DIAGNOSIS — R3915 Urgency of urination: Secondary | ICD-10-CM | POA: Diagnosis not present

## 2021-06-14 ENCOUNTER — Ambulatory Visit (INDEPENDENT_AMBULATORY_CARE_PROVIDER_SITE_OTHER): Payer: Medicare PPO

## 2021-06-14 DIAGNOSIS — I441 Atrioventricular block, second degree: Secondary | ICD-10-CM

## 2021-06-15 LAB — CUP PACEART REMOTE DEVICE CHECK
Battery Remaining Longevity: 94 mo
Battery Voltage: 3.01 V
Brady Statistic AS VP Percent: 83.45 %
Brady Statistic AS VS Percent: 0.52 %
Brady Statistic RV Percent Paced: 88.99 %
Date Time Interrogation Session: 20220803192500
Implantable Pulse Generator Implant Date: 20201103
Lead Channel Impedance Value: 530 Ohm
Lead Channel Pacing Threshold Amplitude: 0.5 V
Lead Channel Pacing Threshold Pulse Width: 0.24 ms
Lead Channel Sensing Intrinsic Amplitude: 11.475 mV
Lead Channel Setting Pacing Amplitude: 1 V
Lead Channel Setting Pacing Pulse Width: 0.24 ms
Lead Channel Setting Sensing Sensitivity: 2 mV

## 2021-06-28 DIAGNOSIS — Z86010 Personal history of colonic polyps: Secondary | ICD-10-CM | POA: Diagnosis not present

## 2021-06-28 DIAGNOSIS — D128 Benign neoplasm of rectum: Secondary | ICD-10-CM | POA: Diagnosis not present

## 2021-06-30 DIAGNOSIS — D128 Benign neoplasm of rectum: Secondary | ICD-10-CM | POA: Diagnosis not present

## 2021-07-09 NOTE — Progress Notes (Signed)
Remote pacemaker transmission.   

## 2021-07-21 DIAGNOSIS — L568 Other specified acute skin changes due to ultraviolet radiation: Secondary | ICD-10-CM | POA: Diagnosis not present

## 2021-07-21 DIAGNOSIS — L814 Other melanin hyperpigmentation: Secondary | ICD-10-CM | POA: Diagnosis not present

## 2021-07-21 DIAGNOSIS — L570 Actinic keratosis: Secondary | ICD-10-CM | POA: Diagnosis not present

## 2021-07-30 NOTE — Progress Notes (Signed)
Electrophysiology Office Note Date: 07/30/2021  ID:  Leonard Maldonado, DOB Nov 06, 1951, MRN [id number]  PCP: Wenda Low, MD Primary Cardiologist: None Electrophysiologist: [name], MD   CC: Pacemaker follow-up  Leonard Maldonado is a 70 y.o. male seen today for [name], MD for routine electrophysiology followup.  Since last being seen in our clinic the patient reports doing very well. Denies any undue SOB.  he denies chest pain, palpitations, dyspnea, PND, orthopnea, nausea, vomiting, dizziness, syncope, edema, weight gain, or early satiety.  Device History: MDT Micra AV PPM implanted 2020 for Mobitz II   Past Medical History:  Diagnosis Date   HTN (hypertension)    Hyperlipemia    Second degree AV block    Past Surgical History:  Procedure Laterality Date   COLONOSCOPY WITH PROPOFOL N/A 11/04/2015   Procedure: COLONOSCOPY WITH PROPOFOL;  Surgeon: Garlan Fair, MD;  Location: [facility];  Service: Endoscopy;  Laterality: N/A;   PACEMAKER LEADLESS INSERTION N/A 09/16/2019   Procedure: PACEMAKER LEADLESS INSERTION;  Surgeon: [name], MD;  Location: [facility];  Service: Cardiovascular;  Laterality: N/A;   SHOULDER SURGERY      Current Outpatient Medications  Medication Sig Dispense Refill   acetaminophen (TYLENOL) 325 MG tablet Take 2 tablets (650 mg total) by mouth every 4 (four) hours as needed for headache or mild pain.     aspirin 81 MG tablet Take 81 mg by mouth 2 (two) times a week.      cholecalciferol (VITAMIN D) 1000 UNITS tablet Take 1,000 Units by mouth daily.     COENZYME Q-10 PO Take 1 tablet by mouth daily.     diclofenac sodium (VOLTAREN) 1 % GEL Apply 2 g topically 4 (four) times daily.     finasteride (PROSCAR) 5 MG tablet Take 5 mg by mouth every evening.   3   latanoprost (XALATAN) 0.005 % ophthalmic solution Place 1 drop into both eyes at bedtime.  3   Magnesium 250 MG TABS Take 250 mg by mouth 2 (two) times a week.     Multiple  Vitamin (MULTIVITAMIN) tablet Take 1 tablet by mouth daily.     Multiple Vitamins-Minerals (EMERGEN-C VITAMIN C) PACK Take 1 Dose by mouth 2 (two) times a week.     Omega-3 Fatty Acids (FISH OIL) 1200 MG CAPS Take 1,200 mg by mouth daily.      omeprazole (PRILOSEC) 40 MG capsule Take 40 mg by mouth 2 (two) times daily with a meal.      Probiotic Product (PROBIOTIC DAILY PO) Take 1 capsule by mouth 2 (two) times a week.      sildenafil (REVATIO) 20 MG tablet Take 20-40 mg by mouth as needed (for ED).      simvastatin (ZOCOR) 20 MG tablet Take 20 mg by mouth at bedtime.      tamsulosin (FLOMAX) 0.4 MG CAPS capsule Take 0.4 mg by mouth 2 (two) times daily.   3   No current facility-administered medications for this visit.    Allergies:   Patient has no known allergies.   Social History: Social History   Socioeconomic History   Marital status: Married    Spouse name: Not on file   Number of children: Not on file   Years of education: Not on file   Highest education level: Not on file  Occupational History   Not on file  Tobacco Use   Smoking status: Former    Packs/day: 1.00  Years: 10.00    Pack years: 10.00    Types: Cigarettes    Quit date: 03/18/1972    Years since quitting: 49.4   Smokeless tobacco: Never  Vaping Use   Vaping Use: Never used  Substance and Sexual Activity   Alcohol use: No    Alcohol/week: 0.0 standard drinks   Drug use: No   Sexual activity: Not on file  Other Topics Concern   Not on file  Social History Narrative   Not on file   Social Determinants of Health   Financial Resource Strain: Not on file  Food Insecurity: Not on file  Transportation Needs: Not on file  Physical Activity: Not on file  Stress: Not on file  Social Connections: Not on file  Intimate Partner Violence: Not on file    Family History: Family History  Problem Relation Age of Onset   Emphysema Father    Lung cancer Father      Review of Systems: All other systems  reviewed and are otherwise negative except as noted above.  Physical Exam: There were no vitals filed for this visit.   GEN- The patient is well appearing, alert and oriented x 3 today.   HEENT: normocephalic, atraumatic; sclera clear, conjunctiva pink; hearing intact; oropharynx clear; neck supple  Lungs- Clear to ausculation bilaterally, normal work of breathing.  No wheezes, rales, rhonchi Heart- Regular rate and rhythm, no murmurs, rubs or gallops  GI- soft, non-tender, non-distended, bowel sounds present  Extremities- no clubbing or cyanosis. No edema MS- no significant deformity or atrophy Skin- warm and dry, no rash or lesion; PPM pocket well healed Psych- euthymic mood, full affect Neuro- strength and sensation are intact  PPM Interrogation- reviewed in detail today,  See PACEART report  EKG:  EKG is not ordered today.  Recent Labs: No results found for requested labs within last 8760 hours.   Wt Readings from Last 3 Encounters:  02/17/21 226 lb (102.5 kg)  01/26/21 226 lb 6.4 oz (102.7 kg)  07/29/20 221 lb (100.2 kg)     Other studies Reviewed: Additional studies/ records that were reviewed today include: Previous EP office notes, Previous remote checks, Most recent labwork.   Assessment and Plan:  1. Second Degree AV block s/p Medtronic PPM  Normal PPM function See Pace Art report No changes today  2. HTN Stable on current regimen   3. DOE Echo 02/17/2021 LVEF 55-60% Myoview with no ischemia, small defects all consistent with artifact. EF normal. Low risk test No undue SOB at this time.  Current medicines are reviewed at length with the patient today.    Disposition:   Follow up with EP APP in 6 Months    Signed, Annamaria Helling  07/30/2021 1:12 PM  [address] [address] [phone] (office) [phone] (fax)

## 2021-08-01 ENCOUNTER — Ambulatory Visit (INDEPENDENT_AMBULATORY_CARE_PROVIDER_SITE_OTHER): Payer: Medicare PPO | Admitting: Student

## 2021-08-01 ENCOUNTER — Encounter: Payer: Medicare PPO | Admitting: Physician Assistant

## 2021-08-01 ENCOUNTER — Encounter: Payer: Self-pay | Admitting: Student

## 2021-08-01 ENCOUNTER — Other Ambulatory Visit: Payer: Self-pay

## 2021-08-01 VITALS — BP 132/78 | HR 68 | Ht 73.0 in | Wt 221.6 lb

## 2021-08-01 DIAGNOSIS — I441 Atrioventricular block, second degree: Secondary | ICD-10-CM

## 2021-08-01 DIAGNOSIS — H401131 Primary open-angle glaucoma, bilateral, mild stage: Secondary | ICD-10-CM | POA: Diagnosis not present

## 2021-08-01 DIAGNOSIS — I10 Essential (primary) hypertension: Secondary | ICD-10-CM

## 2021-08-01 LAB — CUP PACEART INCLINIC DEVICE CHECK
Battery Remaining Longevity: 93 mo
Battery Voltage: 3 V
Brady Statistic AS VP Percent: 83.18 %
Brady Statistic AS VS Percent: 0.64 %
Brady Statistic RV Percent Paced: 88.39 %
Date Time Interrogation Session: 20220919091421
Implantable Pulse Generator Implant Date: 20201103
Lead Channel Impedance Value: 550 Ohm
Lead Channel Pacing Threshold Amplitude: 0.5 V
Lead Channel Pacing Threshold Pulse Width: 0.24 ms
Lead Channel Sensing Intrinsic Amplitude: 12.713 mV
Lead Channel Setting Pacing Amplitude: 1 V
Lead Channel Setting Pacing Pulse Width: 0.24 ms
Lead Channel Setting Sensing Sensitivity: 2 mV

## 2021-08-01 NOTE — Patient Instructions (Signed)

## 2021-09-19 DIAGNOSIS — N528 Other male erectile dysfunction: Secondary | ICD-10-CM | POA: Diagnosis not present

## 2021-09-19 DIAGNOSIS — N401 Enlarged prostate with lower urinary tract symptoms: Secondary | ICD-10-CM | POA: Diagnosis not present

## 2021-09-19 DIAGNOSIS — N200 Calculus of kidney: Secondary | ICD-10-CM | POA: Diagnosis not present

## 2021-09-19 DIAGNOSIS — R3915 Urgency of urination: Secondary | ICD-10-CM | POA: Diagnosis not present

## 2021-09-30 ENCOUNTER — Ambulatory Visit: Payer: Medicare PPO | Admitting: Internal Medicine

## 2021-09-30 ENCOUNTER — Other Ambulatory Visit: Payer: Self-pay | Admitting: Urology

## 2021-09-30 ENCOUNTER — Other Ambulatory Visit: Payer: Self-pay

## 2021-09-30 ENCOUNTER — Encounter: Payer: Self-pay | Admitting: Internal Medicine

## 2021-09-30 VITALS — BP 124/70 | HR 67 | Ht 73.0 in | Wt 224.0 lb

## 2021-09-30 DIAGNOSIS — I10 Essential (primary) hypertension: Secondary | ICD-10-CM | POA: Diagnosis not present

## 2021-09-30 DIAGNOSIS — E785 Hyperlipidemia, unspecified: Secondary | ICD-10-CM

## 2021-09-30 DIAGNOSIS — I441 Atrioventricular block, second degree: Secondary | ICD-10-CM

## 2021-09-30 DIAGNOSIS — N200 Calculus of kidney: Secondary | ICD-10-CM

## 2021-09-30 NOTE — Progress Notes (Signed)
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 male who presents today for routine electrophysiology followup.  Since last being seen in our clinic, the patient reports doing very well.  Today, he denies symptoms of palpitations, chest pain, shortness of breath,  lower extremity edema, dizziness, presyncope, or syncope.  The patient is otherwise without complaint today.   Past Medical History:  Diagnosis Date   HTN (hypertension)    Hyperlipemia    Second degree AV block    Past Surgical History:  Procedure Laterality Date   COLONOSCOPY WITH PROPOFOL N/A 11/04/2015   Procedure: COLONOSCOPY WITH PROPOFOL;  Surgeon: Garlan Fair, MD;  Location: [facility];  Service: Endoscopy;  Laterality: N/A;   PACEMAKER LEADLESS INSERTION N/A 09/16/2019   Procedure: PACEMAKER LEADLESS INSERTION;  Surgeon: [name], MD;  Location: [facility];  Service: Cardiovascular;  Laterality: N/A;   SHOULDER SURGERY      ROS- all systems are reviewed and negative except as per HPI above  Current Outpatient Medications  Medication Sig Dispense Refill   acetaminophen (TYLENOL) 325 MG tablet Take 2 tablets (650 mg total) by mouth every 4 (four) hours as needed for headache or mild pain.     aspirin 81 MG tablet Take 81 mg by mouth 2 (two) times a week.      calcium carbonate (OS-CAL) 600 MG tablet daily at 6 (six) AM.     cholecalciferol (VITAMIN D) 1000 UNITS tablet Take 1,000 Units by mouth daily.     COENZYME Q-10 PO Take 1 tablet by mouth daily.     diclofenac sodium (VOLTAREN) 1 % GEL Apply 2 g topically 4 (four) times daily.     finasteride (PROSCAR) 5 MG tablet Take 5 mg by mouth every evening.   3   latanoprost (XALATAN) 0.005 % ophthalmic solution Place 1 drop into both eyes at bedtime.  3   Magnesium 250 MG TABS Take 250 mg by mouth 2 (two) times a week.     Multiple Vitamin (MULTIVITAMIN) tablet Take 1 tablet by mouth daily.     Multiple  Vitamins-Minerals (EMERGEN-C VITAMIN C) PACK Take 1 Dose by mouth 2 (two) times a week.     Omega-3 Fatty Acids (FISH OIL) 1200 MG CAPS Take 1,200 mg by mouth daily.      omeprazole (PRILOSEC) 40 MG capsule Take 40 mg by mouth 2 (two) times daily with a meal.      Probiotic Product (PROBIOTIC DAILY PO) Take 1 capsule by mouth 2 (two) times a week.      sildenafil (REVATIO) 20 MG tablet Take 20-40 mg by mouth as needed (for ED).      simvastatin (ZOCOR) 20 MG tablet Take 20 mg by mouth at bedtime.      tamsulosin (FLOMAX) 0.4 MG CAPS capsule Take 0.4 mg by mouth 2 (two) times daily.   3   No current facility-administered medications for this visit.    Physical Exam: Vitals:   09/30/21 0922  BP: 124/70  Pulse: 67  SpO2: 97%  Weight: 224 lb (101.6 kg)  Height: 6\' 1"  (1.854 m)    GEN- The patient is well appearing, alert and oriented x 3 today.   Head- normocephalic, atraumatic Eyes-  Sclera clear, conjunctiva pink Ears- hearing intact Oropharynx- clear Lungs- Clear to ausculation bilaterally, normal work of breathing Heart- Regular rate and rhythm, no murmurs, rubs or gallops, PMI not laterally  displaced GI- soft, NT, ND, + BS Extremities- no clubbing, cyanosis, or edema  Pacemaker interrogation- reviewed in detail today,  See PACEART report  ekg tracing ordered today is personally reviewed and shows sinus V paced  Assessment and Plan:  1. Symptomatic second degree heart block Normal pacemaker function See Claudia Desanctis Art report No changes today he is not device dependant today He is planned to have lithotripsy on Monday.  Ok to proceed.  I have spoken with Rolla Plate with anesthesia who will be present to program asynchronous mode for the procedure. Ok to proceed without further CV testing.  2. HTN Stable No change required today  Return in a year  [name] MD, [facility] 09/30/2021 9:46 AM

## 2021-09-30 NOTE — Patient Instructions (Addendum)
Medication Instructions:  Your physician recommends that you continue on your current medications as directed. Please refer to the Current Medication list given to you today. *If you need a refill on your cardiac medications before your next appointment, please call your pharmacy*  Lab Work: None. If you have labs (blood work) drawn today and your tests are completely normal, you will receive your results only by: [place] (if you have MyChart) OR A paper copy in the mail If you have any lab test that is abnormal or we need to change your treatment, we will call you to review the results.  Testing/Procedures: None.  Follow-Up: At [facility], you and your health needs are our priority.  As part of our continuing mission to provide you with exceptional heart care, we have created designated Provider Care Teams.  These Care Teams include your primary Cardiologist (physician) and Advanced Practice Providers (APPs -  Physician Assistants and Nurse Practitioners) who all work together to provide you with the care you need, when you need it.  Your physician wants you to follow-up in: 12 months with  [name], MD or one of the following Advanced Practice Providers on your designated Care Team:    Legrand Como "Jonni Sanger" Chalmers Cater, [place]   You will receive a reminder letter in the mail two months in advance. If you don't receive a letter, please call our office to schedule the follow-up appointment.  Remote monitoring is used to monitor your Pacemaker from home. This monitoring reduces the number of office visits required to check your device to one time per year. It allows [place] to keep an eye on the functioning of your device to ensure it is working properly. You are scheduled for a device check from home on 12-13-21. You may send your transmission at any time that day. If you have a wireless device, the transmission will be sent automatically. After your physician reviews your transmission, you will  receive a postcard with your next transmission date.  We recommend signing up for the patient portal called "MyChart".  Sign up information is provided on this After Visit Summary.  MyChart is used to connect with patients for Virtual Visits (Telemedicine).  Patients are able to view lab/test results, encounter notes, upcoming appointments, etc.  Non-urgent messages can be sent to your provider as well.   To learn more about what you can do with MyChart, go to [email or web address].    Any Other Special Instructions Will Be Listed Below (If Applicable).

## 2021-10-04 NOTE — Progress Notes (Signed)
Patient to arrive at 0600 on 10/10/2021. History and medications reviewed. Pre-procedure instructions given. NPO after MN on Sunday except for clear liquids until 0330. Patient has stopped ASA and supplements as instructed per MD office. Driver secured. [employer] representative Barton Dubois contacted with instructions for day of procedure as indicated per cardiac clearance form from [employer]. He has confirmed he will be here pre and post procedure for Pacemaker care.

## 2021-10-10 ENCOUNTER — Encounter (HOSPITAL_BASED_OUTPATIENT_CLINIC_OR_DEPARTMENT_OTHER): Payer: Self-pay | Admitting: Urology

## 2021-10-10 ENCOUNTER — Ambulatory Visit (HOSPITAL_COMMUNITY): Payer: Medicare PPO

## 2021-10-10 ENCOUNTER — Ambulatory Visit (HOSPITAL_BASED_OUTPATIENT_CLINIC_OR_DEPARTMENT_OTHER): Payer: Medicare PPO | Admitting: Anesthesiology

## 2021-10-10 ENCOUNTER — Encounter (HOSPITAL_BASED_OUTPATIENT_CLINIC_OR_DEPARTMENT_OTHER)
Admission: RE | Disposition: A | Discharge status: Home / Self Care | Payer: Self-pay | Source: Home / Self Care | Attending: Urology

## 2021-10-10 ENCOUNTER — Other Ambulatory Visit: Payer: Self-pay

## 2021-10-10 ENCOUNTER — Ambulatory Visit (HOSPITAL_BASED_OUTPATIENT_CLINIC_OR_DEPARTMENT_OTHER)
Admission: RE | Admit: 2021-10-10 | Discharge: 2021-10-10 | Disposition: A | Discharge status: Home / Self Care | Payer: Medicare PPO | Attending: Urology | Admitting: Urology

## 2021-10-10 DIAGNOSIS — N23 Unspecified renal colic: Secondary | ICD-10-CM | POA: Diagnosis not present

## 2021-10-10 DIAGNOSIS — N401 Enlarged prostate with lower urinary tract symptoms: Secondary | ICD-10-CM | POA: Diagnosis not present

## 2021-10-10 DIAGNOSIS — N529 Male erectile dysfunction, unspecified: Secondary | ICD-10-CM | POA: Insufficient documentation

## 2021-10-10 DIAGNOSIS — R350 Frequency of micturition: Secondary | ICD-10-CM | POA: Insufficient documentation

## 2021-10-10 DIAGNOSIS — N200 Calculus of kidney: Secondary | ICD-10-CM | POA: Insufficient documentation

## 2021-10-10 DIAGNOSIS — R3915 Urgency of urination: Secondary | ICD-10-CM | POA: Insufficient documentation

## 2021-10-10 DIAGNOSIS — Z79899 Other long term (current) drug therapy: Secondary | ICD-10-CM | POA: Diagnosis not present

## 2021-10-10 DIAGNOSIS — M47816 Spondylosis without myelopathy or radiculopathy, lumbar region: Secondary | ICD-10-CM | POA: Diagnosis not present

## 2021-10-10 HISTORY — PX: EXTRACORPOREAL SHOCK WAVE LITHOTRIPSY: SHX1557

## 2021-10-10 SURGERY — LITHOTRIPSY, ESWL
Anesthesia: Monitor Anesthesia Care | Laterality: Left

## 2021-10-10 MED ORDER — SODIUM CHLORIDE 0.9 % IV SOLN: INTRAVENOUS | Status: DC

## 2021-10-10 MED ORDER — HYDROCODONE-ACETAMINOPHEN 5-325 MG PO TABS: 1.0000 | ORAL_TABLET | ORAL | 0 refills | Status: AC | PRN

## 2021-10-10 MED ORDER — DIPHENHYDRAMINE HCL 25 MG PO CAPS
ORAL_CAPSULE | ORAL | Status: AC
  Filled 2021-10-10: qty 1

## 2021-10-10 MED ORDER — DIAZEPAM 5 MG PO TABS
ORAL_TABLET | ORAL | Status: AC
  Filled 2021-10-10: qty 2

## 2021-10-10 MED ORDER — CIPROFLOXACIN HCL 500 MG PO TABS
500.0000 mg | ORAL_TABLET | ORAL | Status: AC
  Administered 2021-10-10: 06:00:00 500 mg via ORAL

## 2021-10-10 MED ORDER — DIAZEPAM 5 MG PO TABS
10.0000 mg | ORAL_TABLET | ORAL | Status: AC
  Administered 2021-10-10: 06:00:00 10 mg via ORAL

## 2021-10-10 MED ORDER — DIPHENHYDRAMINE HCL 25 MG PO CAPS
25.0000 mg | ORAL_CAPSULE | ORAL | Status: AC
  Administered 2021-10-10: 06:00:00 25 mg via ORAL

## 2021-10-10 MED ORDER — CIPROFLOXACIN HCL 500 MG PO TABS
ORAL_TABLET | ORAL | Status: AC
  Filled 2021-10-10: qty 1

## 2021-10-10 NOTE — Discharge Instructions (Signed)

## 2021-10-10 NOTE — H&P (Signed)
See scanned H&P

## 2021-10-10 NOTE — Progress Notes (Addendum)
[employer] rep Leanna Sato) in to interogate pacer and return to patient settings.

## 2021-10-10 NOTE — Anesthesia Preprocedure Evaluation (Deleted)
Anesthesia Evaluation  Patient identified by MRN, date of birth, ID band Patient awake    Reviewed: Allergy & Precautions, NPO status , Patient's Chart, lab work & pertinent test results  Airway Mallampati: II  TM Distance: >3 FB Neck ROM: Full    Dental no notable dental hx.    Pulmonary asthma , former smoker,  Former smoker, quit 1973, 10 pack year history    Pulmonary exam normal breath sounds clear to auscultation       Cardiovascular hypertension, Pt. on medications Normal cardiovascular exam+ dysrhythmias (2nd deg AVB, PPM 2020) + pacemaker (leadless PM 2020)  Rhythm:Regular Rate:Normal     Neuro/Psych negative neurological ROS  negative psych ROS   GI/Hepatic Neg liver ROS, GERD  Medicated and Controlled,  Endo/Other  negative endocrine ROS  Renal/GU Renal disease (L renal stone)  negative genitourinary   Musculoskeletal negative musculoskeletal ROS (+)   Abdominal   Peds  Hematology negative hematology ROS (+)   Anesthesia Other Findings   Reproductive/Obstetrics negative OB ROS                             Anesthesia Physical Anesthesia Plan  ASA: 2  Anesthesia Plan: MAC   Post-op Pain Management:    Induction:   PONV Risk Score and Plan: Treatment may vary due to age or medical condition  Airway Management Planned: Natural Airway and Simple Face Mask  Additional Equipment: None  Intra-op Plan:   Post-operative Plan: Extubation in OR  Informed Consent: I have reviewed the patients History and Physical, chart, labs and discussed the procedure including the risks, benefits and alternatives for the proposed anesthesia with the patient or authorized representative who has indicated his/her understanding and acceptance.     Dental advisory given  Plan Discussed with: CRNA  Anesthesia Plan Comments:         Anesthesia Quick Evaluation

## 2021-10-10 NOTE — Op Note (Signed)
See [facility] OP note scanned into chart. Also because of the size, density, location and other factors that cannot be anticipated I feel this will likely be a staged procedure. This fact supersedes any indication in the scanned [facility] operative note to the contrary.  

## 2021-10-11 ENCOUNTER — Encounter (HOSPITAL_BASED_OUTPATIENT_CLINIC_OR_DEPARTMENT_OTHER): Payer: Self-pay | Admitting: Urology

## 2021-10-13 DIAGNOSIS — I10 Essential (primary) hypertension: Secondary | ICD-10-CM | POA: Diagnosis not present

## 2021-10-13 DIAGNOSIS — N183 Chronic kidney disease, stage 3 unspecified: Secondary | ICD-10-CM | POA: Diagnosis not present

## 2021-10-13 DIAGNOSIS — Z9889 Other specified postprocedural states: Secondary | ICD-10-CM | POA: Diagnosis not present

## 2021-10-19 ENCOUNTER — Ambulatory Visit (INDEPENDENT_AMBULATORY_CARE_PROVIDER_SITE_OTHER): Payer: Medicare PPO

## 2021-10-19 ENCOUNTER — Encounter: Payer: Self-pay | Admitting: Internal Medicine

## 2021-10-19 DIAGNOSIS — I441 Atrioventricular block, second degree: Secondary | ICD-10-CM | POA: Diagnosis not present

## 2021-10-19 LAB — CUP PACEART REMOTE DEVICE CHECK
Battery Remaining Longevity: 89 mo
Battery Voltage: 3 V
Brady Statistic AS VP Percent: 89.91 %
Brady Statistic AS VS Percent: 0.76 %
Brady Statistic RV Percent Paced: 93.04 %
Date Time Interrogation Session: 20221206173700
Implantable Pulse Generator Implant Date: 20201103
Lead Channel Impedance Value: 510 Ohm
Lead Channel Pacing Threshold Amplitude: 0.5 V
Lead Channel Pacing Threshold Pulse Width: 0.24 ms
Lead Channel Sensing Intrinsic Amplitude: 12.15 mV
Lead Channel Setting Pacing Amplitude: 1 V
Lead Channel Setting Pacing Pulse Width: 0.24 ms
Lead Channel Setting Sensing Sensitivity: 2 mV

## 2021-10-20 DIAGNOSIS — N179 Acute kidney failure, unspecified: Secondary | ICD-10-CM | POA: Diagnosis not present

## 2021-10-25 DIAGNOSIS — N200 Calculus of kidney: Secondary | ICD-10-CM | POA: Diagnosis not present

## 2021-10-28 ENCOUNTER — Encounter: Payer: Self-pay | Admitting: Internal Medicine

## 2021-10-28 NOTE — Progress Notes (Signed)
Remote pacemaker transmission.   

## 2021-11-11 DIAGNOSIS — Z20822 Contact with and (suspected) exposure to covid-19: Secondary | ICD-10-CM | POA: Diagnosis not present

## 2021-11-12 DIAGNOSIS — N529 Male erectile dysfunction, unspecified: Secondary | ICD-10-CM | POA: Diagnosis not present

## 2021-11-12 DIAGNOSIS — E785 Hyperlipidemia, unspecified: Secondary | ICD-10-CM | POA: Diagnosis not present

## 2021-11-12 DIAGNOSIS — Z7982 Long term (current) use of aspirin: Secondary | ICD-10-CM | POA: Diagnosis not present

## 2021-11-12 DIAGNOSIS — N400 Enlarged prostate without lower urinary tract symptoms: Secondary | ICD-10-CM | POA: Diagnosis not present

## 2021-11-12 DIAGNOSIS — R32 Unspecified urinary incontinence: Secondary | ICD-10-CM | POA: Diagnosis not present

## 2021-11-12 DIAGNOSIS — M1990 Unspecified osteoarthritis, unspecified site: Secondary | ICD-10-CM | POA: Diagnosis not present

## 2021-11-12 DIAGNOSIS — K219 Gastro-esophageal reflux disease without esophagitis: Secondary | ICD-10-CM | POA: Diagnosis not present

## 2021-11-12 DIAGNOSIS — Z20822 Contact with and (suspected) exposure to covid-19: Secondary | ICD-10-CM | POA: Diagnosis not present

## 2021-11-12 DIAGNOSIS — H409 Unspecified glaucoma: Secondary | ICD-10-CM | POA: Diagnosis not present

## 2021-11-23 DIAGNOSIS — H401131 Primary open-angle glaucoma, bilateral, mild stage: Secondary | ICD-10-CM | POA: Diagnosis not present

## 2021-12-01 DIAGNOSIS — E7800 Pure hypercholesterolemia, unspecified: Secondary | ICD-10-CM | POA: Diagnosis not present

## 2021-12-01 DIAGNOSIS — N1831 Chronic kidney disease, stage 3a: Secondary | ICD-10-CM | POA: Diagnosis not present

## 2021-12-01 DIAGNOSIS — N200 Calculus of kidney: Secondary | ICD-10-CM | POA: Diagnosis not present

## 2021-12-01 DIAGNOSIS — I700 Atherosclerosis of aorta: Secondary | ICD-10-CM | POA: Diagnosis not present

## 2021-12-01 DIAGNOSIS — N400 Enlarged prostate without lower urinary tract symptoms: Secondary | ICD-10-CM | POA: Diagnosis not present

## 2021-12-01 DIAGNOSIS — I10 Essential (primary) hypertension: Secondary | ICD-10-CM | POA: Diagnosis not present

## 2021-12-22 DIAGNOSIS — M549 Dorsalgia, unspecified: Secondary | ICD-10-CM | POA: Diagnosis not present

## 2021-12-22 DIAGNOSIS — M5136 Other intervertebral disc degeneration, lumbar region: Secondary | ICD-10-CM | POA: Diagnosis not present

## 2022-01-10 DIAGNOSIS — M545 Low back pain, unspecified: Secondary | ICD-10-CM | POA: Diagnosis not present

## 2022-01-18 ENCOUNTER — Ambulatory Visit (INDEPENDENT_AMBULATORY_CARE_PROVIDER_SITE_OTHER): Payer: Medicare PPO

## 2022-01-18 DIAGNOSIS — I441 Atrioventricular block, second degree: Secondary | ICD-10-CM | POA: Diagnosis not present

## 2022-01-18 LAB — CUP PACEART REMOTE DEVICE CHECK
Battery Remaining Longevity: 88 mo
Battery Voltage: 3 V
Brady Statistic AS VP Percent: 74.14 %
Brady Statistic AS VS Percent: 0.93 %
Brady Statistic RV Percent Paced: 76.21 %
Date Time Interrogation Session: 20230308054800
Implantable Pulse Generator Implant Date: 20201103
Lead Channel Impedance Value: 490 Ohm
Lead Channel Pacing Threshold Amplitude: 0.5 V
Lead Channel Pacing Threshold Pulse Width: 0.24 ms
Lead Channel Sensing Intrinsic Amplitude: 11.363 mV
Lead Channel Setting Pacing Amplitude: 1 V
Lead Channel Setting Pacing Pulse Width: 0.24 ms
Lead Channel Setting Sensing Sensitivity: 2 mV

## 2022-01-24 DIAGNOSIS — H401131 Primary open-angle glaucoma, bilateral, mild stage: Secondary | ICD-10-CM | POA: Diagnosis not present

## 2022-02-01 NOTE — Progress Notes (Signed)
Remote pacemaker transmission.   

## 2022-02-06 DIAGNOSIS — Z03818 Encounter for observation for suspected exposure to other biological agents ruled out: Secondary | ICD-10-CM | POA: Diagnosis not present

## 2022-02-06 DIAGNOSIS — J019 Acute sinusitis, unspecified: Secondary | ICD-10-CM | POA: Diagnosis not present

## 2022-02-06 DIAGNOSIS — R051 Acute cough: Secondary | ICD-10-CM | POA: Diagnosis not present

## 2022-03-02 DIAGNOSIS — N200 Calculus of kidney: Secondary | ICD-10-CM | POA: Diagnosis not present

## 2022-03-02 DIAGNOSIS — R3915 Urgency of urination: Secondary | ICD-10-CM | POA: Diagnosis not present

## 2022-03-02 DIAGNOSIS — N401 Enlarged prostate with lower urinary tract symptoms: Secondary | ICD-10-CM | POA: Diagnosis not present

## 2022-03-02 DIAGNOSIS — N23 Unspecified renal colic: Secondary | ICD-10-CM | POA: Diagnosis not present

## 2022-03-30 ENCOUNTER — Other Ambulatory Visit: Payer: Self-pay | Admitting: Internal Medicine

## 2022-03-30 ENCOUNTER — Ambulatory Visit
Admission: RE | Admit: 2022-03-30 | Discharge: 2022-03-30 | Disposition: A | Discharge status: Home / Self Care | Payer: Medicare PPO | Source: Ambulatory Visit | Attending: Internal Medicine | Admitting: Internal Medicine

## 2022-03-30 DIAGNOSIS — M79642 Pain in left hand: Secondary | ICD-10-CM

## 2022-03-30 DIAGNOSIS — M7989 Other specified soft tissue disorders: Secondary | ICD-10-CM | POA: Diagnosis not present

## 2022-04-15 IMAGING — DX DG ABDOMEN 1V
3 series · 3 of 3 positions shown · non-contrast
Comparison: 09/19/2021.  Abdomen and pelvis CT dated 10/18/2018.

CLINICAL DATA: Left nephrolithiasis.  Pre lithotripsy evaluation.

EXAM:
ABDOMEN - 1 VIEW

[abdomen kub (1 of 3)]
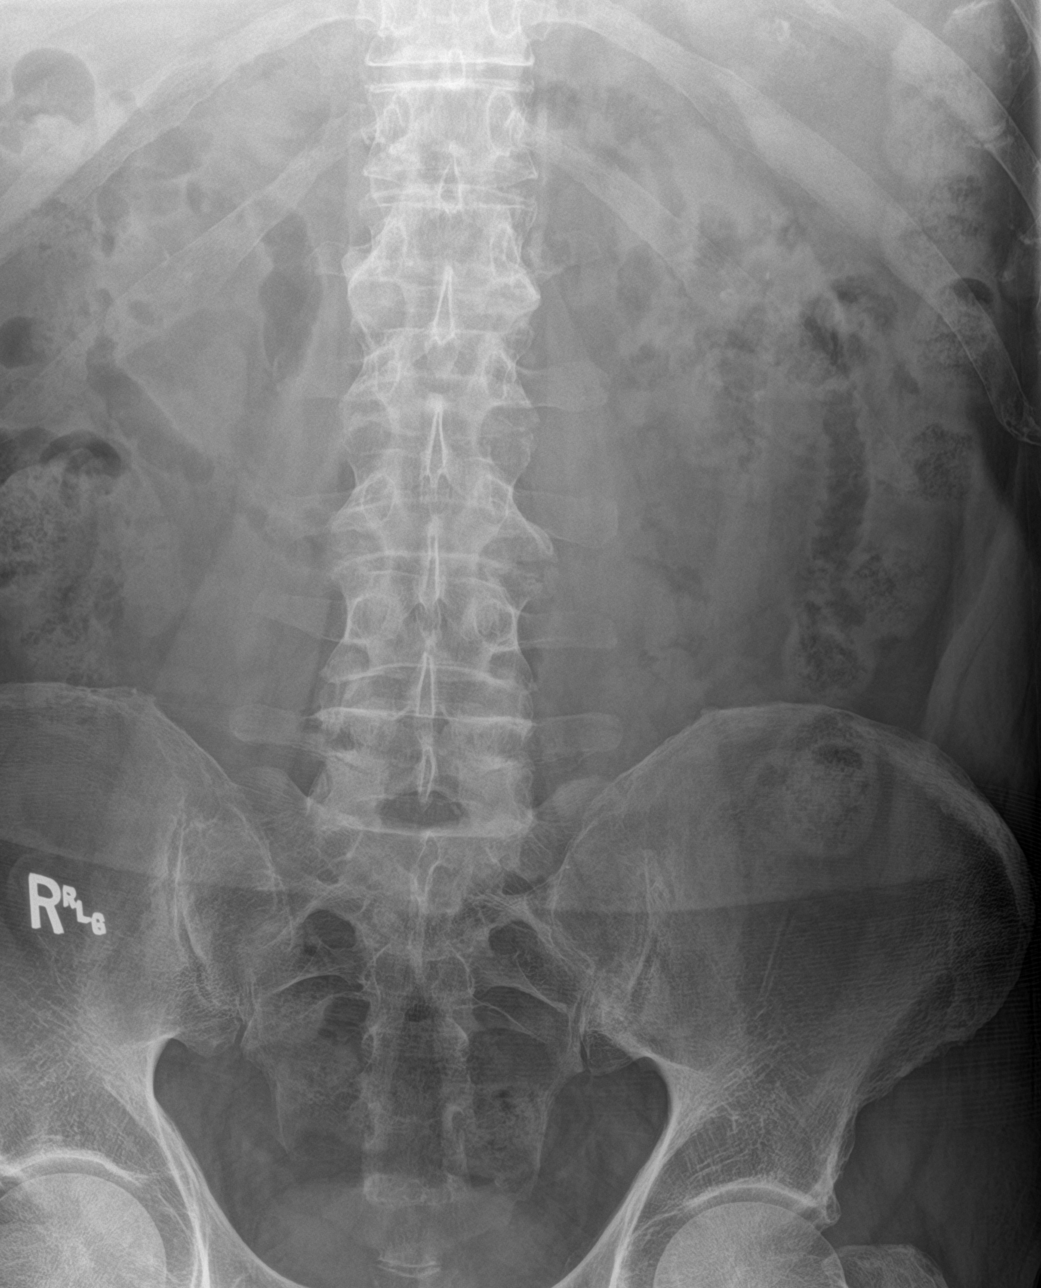

[abdomen kub (2 of 3)]
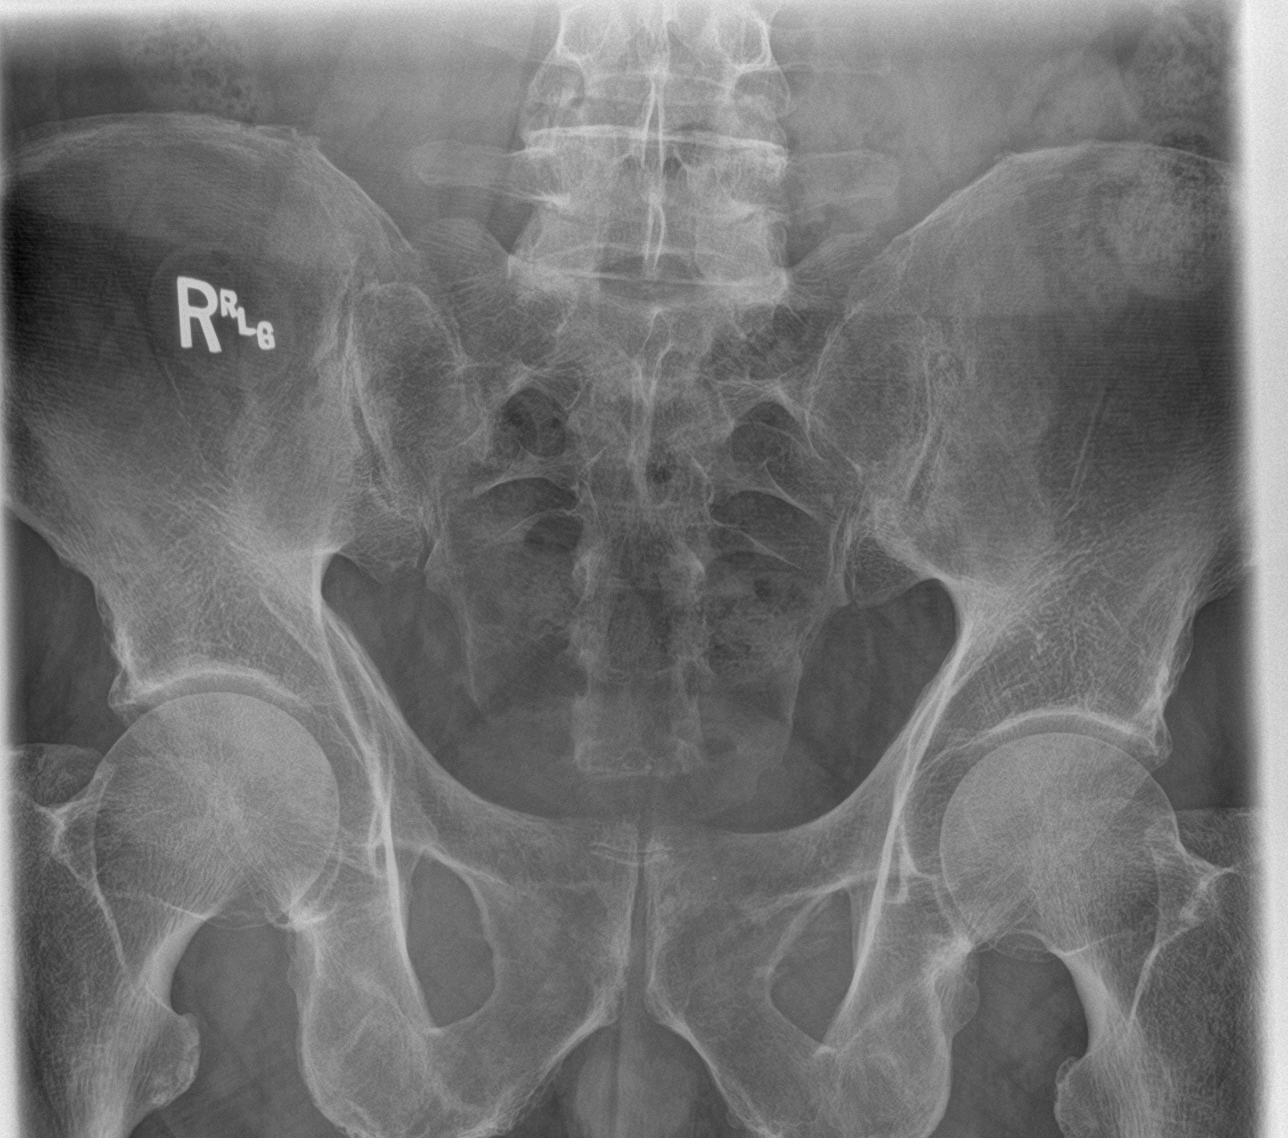

[abdomen kub (3 of 3)]
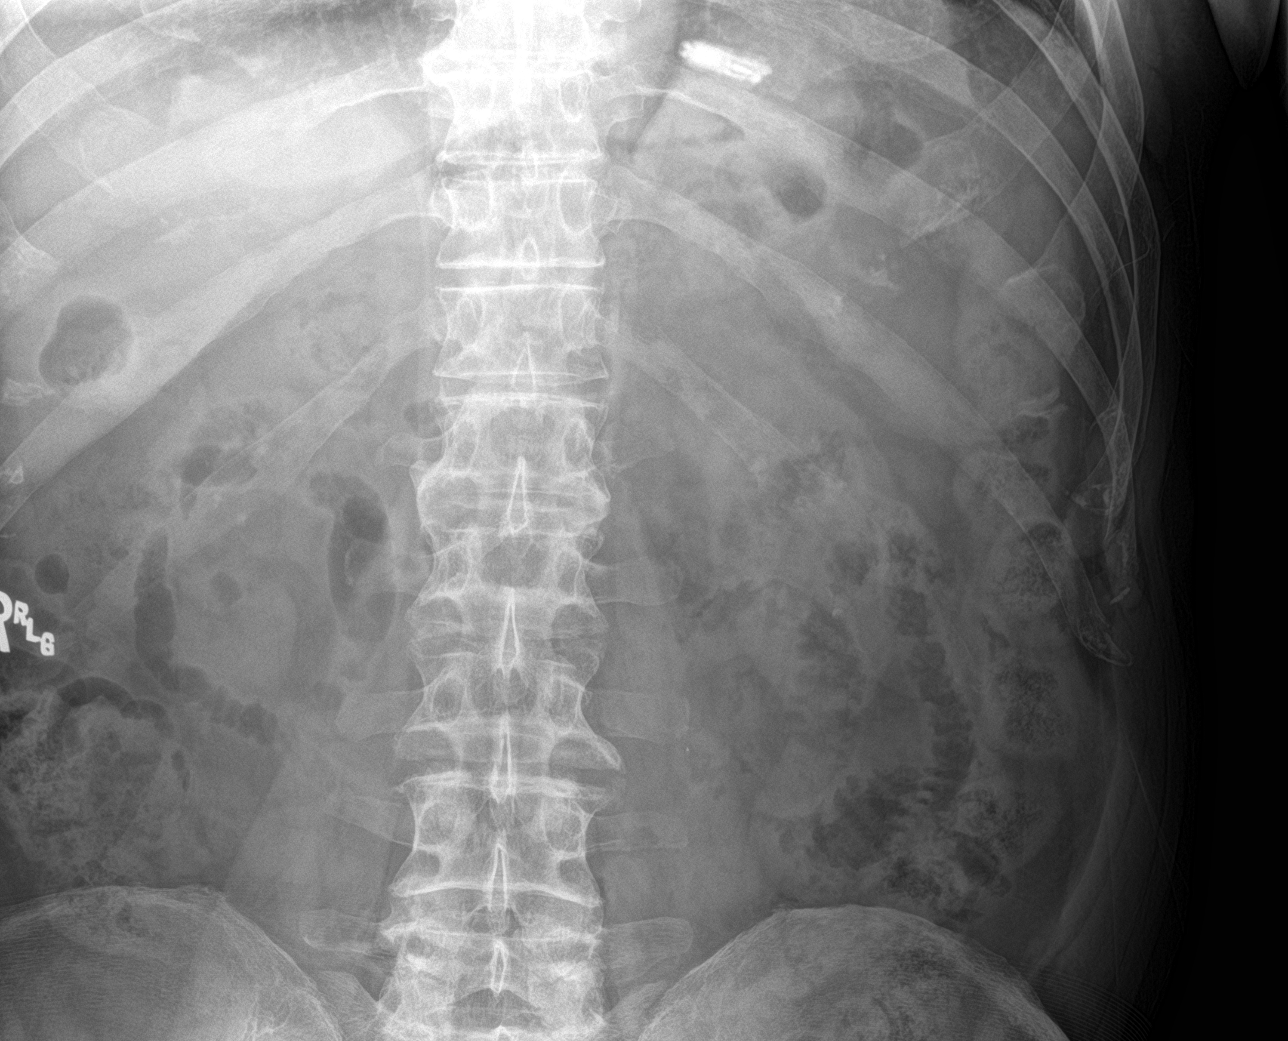

[3 of 3 positions shown; findings below may reference images not displayed]

FINDINGS: Normal bowel gas pattern. No significant change in small bilateral
renal calculi. Lumbar spine degenerative changes.
IMPRESSION: Stable small bilateral renal calculi.

## 2022-04-19 ENCOUNTER — Ambulatory Visit (INDEPENDENT_AMBULATORY_CARE_PROVIDER_SITE_OTHER): Payer: Medicare PPO

## 2022-04-19 DIAGNOSIS — I441 Atrioventricular block, second degree: Secondary | ICD-10-CM | POA: Diagnosis not present

## 2022-04-20 LAB — CUP PACEART REMOTE DEVICE CHECK
Battery Remaining Longevity: 88 mo
Battery Voltage: 2.99 V
Brady Statistic AS VP Percent: 80.13 %
Brady Statistic AS VS Percent: 0.49 %
Brady Statistic RV Percent Paced: 87.29 %
Date Time Interrogation Session: 20230607133600
Implantable Pulse Generator Implant Date: 20201103
Lead Channel Impedance Value: 510 Ohm
Lead Channel Pacing Threshold Amplitude: 0.5 V
Lead Channel Pacing Threshold Pulse Width: 0.24 ms
Lead Channel Sensing Intrinsic Amplitude: 24.188 mV
Lead Channel Setting Pacing Amplitude: 1.125
Lead Channel Setting Pacing Pulse Width: 0.24 ms
Lead Channel Setting Sensing Sensitivity: 2 mV

## 2022-04-28 NOTE — Progress Notes (Signed)
Remote pacemaker transmission.   

## 2022-05-03 DIAGNOSIS — H401131 Primary open-angle glaucoma, bilateral, mild stage: Secondary | ICD-10-CM | POA: Diagnosis not present

## 2022-06-01 DIAGNOSIS — E7800 Pure hypercholesterolemia, unspecified: Secondary | ICD-10-CM | POA: Diagnosis not present

## 2022-06-01 DIAGNOSIS — Z950 Presence of cardiac pacemaker: Secondary | ICD-10-CM | POA: Diagnosis not present

## 2022-06-01 DIAGNOSIS — N400 Enlarged prostate without lower urinary tract symptoms: Secondary | ICD-10-CM | POA: Diagnosis not present

## 2022-06-01 DIAGNOSIS — Z1331 Encounter for screening for depression: Secondary | ICD-10-CM | POA: Diagnosis not present

## 2022-06-01 DIAGNOSIS — K219 Gastro-esophageal reflux disease without esophagitis: Secondary | ICD-10-CM | POA: Diagnosis not present

## 2022-06-01 DIAGNOSIS — I10 Essential (primary) hypertension: Secondary | ICD-10-CM | POA: Diagnosis not present

## 2022-06-01 DIAGNOSIS — N200 Calculus of kidney: Secondary | ICD-10-CM | POA: Diagnosis not present

## 2022-06-01 DIAGNOSIS — I700 Atherosclerosis of aorta: Secondary | ICD-10-CM | POA: Diagnosis not present

## 2022-06-01 DIAGNOSIS — N1831 Chronic kidney disease, stage 3a: Secondary | ICD-10-CM | POA: Diagnosis not present

## 2022-06-01 DIAGNOSIS — Z0000 Encounter for general adult medical examination without abnormal findings: Secondary | ICD-10-CM | POA: Diagnosis not present

## 2022-06-06 DIAGNOSIS — N400 Enlarged prostate without lower urinary tract symptoms: Secondary | ICD-10-CM | POA: Diagnosis not present

## 2022-06-06 DIAGNOSIS — H409 Unspecified glaucoma: Secondary | ICD-10-CM | POA: Diagnosis not present

## 2022-06-06 DIAGNOSIS — M1990 Unspecified osteoarthritis, unspecified site: Secondary | ICD-10-CM | POA: Diagnosis not present

## 2022-06-06 DIAGNOSIS — E785 Hyperlipidemia, unspecified: Secondary | ICD-10-CM | POA: Diagnosis not present

## 2022-06-06 DIAGNOSIS — N529 Male erectile dysfunction, unspecified: Secondary | ICD-10-CM | POA: Diagnosis not present

## 2022-06-06 DIAGNOSIS — I700 Atherosclerosis of aorta: Secondary | ICD-10-CM | POA: Diagnosis not present

## 2022-06-06 DIAGNOSIS — R32 Unspecified urinary incontinence: Secondary | ICD-10-CM | POA: Diagnosis not present

## 2022-06-06 DIAGNOSIS — K219 Gastro-esophageal reflux disease without esophagitis: Secondary | ICD-10-CM | POA: Diagnosis not present

## 2022-06-06 DIAGNOSIS — R030 Elevated blood-pressure reading, without diagnosis of hypertension: Secondary | ICD-10-CM | POA: Diagnosis not present

## 2022-06-21 ENCOUNTER — Other Ambulatory Visit: Payer: Self-pay | Admitting: Physical Medicine and Rehabilitation

## 2022-06-21 ENCOUNTER — Ambulatory Visit
Admission: RE | Admit: 2022-06-21 | Discharge: 2022-06-21 | Disposition: A | Discharge status: Home / Self Care | Payer: Medicare PPO | Source: Ambulatory Visit | Attending: Physical Medicine and Rehabilitation | Admitting: Physical Medicine and Rehabilitation

## 2022-06-21 DIAGNOSIS — M47816 Spondylosis without myelopathy or radiculopathy, lumbar region: Secondary | ICD-10-CM

## 2022-06-21 DIAGNOSIS — M47817 Spondylosis without myelopathy or radiculopathy, lumbosacral region: Secondary | ICD-10-CM | POA: Diagnosis not present

## 2022-06-21 DIAGNOSIS — G8929 Other chronic pain: Secondary | ICD-10-CM | POA: Diagnosis not present

## 2022-06-21 DIAGNOSIS — M47815 Spondylosis without myelopathy or radiculopathy, thoracolumbar region: Secondary | ICD-10-CM | POA: Diagnosis not present

## 2022-06-29 DIAGNOSIS — M2560 Stiffness of unspecified joint, not elsewhere classified: Secondary | ICD-10-CM | POA: Diagnosis not present

## 2022-06-29 DIAGNOSIS — M545 Low back pain, unspecified: Secondary | ICD-10-CM | POA: Diagnosis not present

## 2022-07-10 DIAGNOSIS — M47816 Spondylosis without myelopathy or radiculopathy, lumbar region: Secondary | ICD-10-CM | POA: Diagnosis not present

## 2022-07-10 DIAGNOSIS — M6281 Muscle weakness (generalized): Secondary | ICD-10-CM | POA: Diagnosis not present

## 2022-07-10 DIAGNOSIS — M545 Low back pain, unspecified: Secondary | ICD-10-CM | POA: Diagnosis not present

## 2022-07-10 DIAGNOSIS — M2560 Stiffness of unspecified joint, not elsewhere classified: Secondary | ICD-10-CM | POA: Diagnosis not present

## 2022-07-12 DIAGNOSIS — M2560 Stiffness of unspecified joint, not elsewhere classified: Secondary | ICD-10-CM | POA: Diagnosis not present

## 2022-07-12 DIAGNOSIS — M6281 Muscle weakness (generalized): Secondary | ICD-10-CM | POA: Diagnosis not present

## 2022-07-12 DIAGNOSIS — M47816 Spondylosis without myelopathy or radiculopathy, lumbar region: Secondary | ICD-10-CM | POA: Diagnosis not present

## 2022-07-12 DIAGNOSIS — M545 Low back pain, unspecified: Secondary | ICD-10-CM | POA: Diagnosis not present

## 2022-07-19 ENCOUNTER — Ambulatory Visit (INDEPENDENT_AMBULATORY_CARE_PROVIDER_SITE_OTHER): Payer: Medicare PPO

## 2022-07-19 DIAGNOSIS — I441 Atrioventricular block, second degree: Secondary | ICD-10-CM | POA: Diagnosis not present

## 2022-07-20 LAB — CUP PACEART REMOTE DEVICE CHECK
Battery Remaining Longevity: 85 mo
Battery Voltage: 2.99 V
Brady Statistic AS VP Percent: 82.31 %
Brady Statistic AS VS Percent: 0.33 %
Brady Statistic RV Percent Paced: 91.39 %
Date Time Interrogation Session: 20230906164500
Implantable Pulse Generator Implant Date: 20201103
Lead Channel Impedance Value: 510 Ohm
Lead Channel Pacing Threshold Amplitude: 0.5 V
Lead Channel Pacing Threshold Pulse Width: 0.24 ms
Lead Channel Sensing Intrinsic Amplitude: 9 mV
Lead Channel Setting Pacing Amplitude: 1.125
Lead Channel Setting Pacing Pulse Width: 0.24 ms
Lead Channel Setting Sensing Sensitivity: 2 mV

## 2022-07-24 DIAGNOSIS — L814 Other melanin hyperpigmentation: Secondary | ICD-10-CM | POA: Diagnosis not present

## 2022-07-24 DIAGNOSIS — L821 Other seborrheic keratosis: Secondary | ICD-10-CM | POA: Diagnosis not present

## 2022-07-24 DIAGNOSIS — L570 Actinic keratosis: Secondary | ICD-10-CM | POA: Diagnosis not present

## 2022-07-24 DIAGNOSIS — D1801 Hemangioma of skin and subcutaneous tissue: Secondary | ICD-10-CM | POA: Diagnosis not present

## 2022-08-07 DIAGNOSIS — H401131 Primary open-angle glaucoma, bilateral, mild stage: Secondary | ICD-10-CM | POA: Diagnosis not present

## 2022-08-07 DIAGNOSIS — M47816 Spondylosis without myelopathy or radiculopathy, lumbar region: Secondary | ICD-10-CM | POA: Diagnosis not present

## 2022-08-07 NOTE — Progress Notes (Signed)
Remote pacemaker transmission.   

## 2022-08-08 DIAGNOSIS — M545 Low back pain, unspecified: Secondary | ICD-10-CM | POA: Diagnosis not present

## 2022-08-08 DIAGNOSIS — M47816 Spondylosis without myelopathy or radiculopathy, lumbar region: Secondary | ICD-10-CM | POA: Diagnosis not present

## 2022-08-08 DIAGNOSIS — M2560 Stiffness of unspecified joint, not elsewhere classified: Secondary | ICD-10-CM | POA: Diagnosis not present

## 2022-08-08 DIAGNOSIS — M6281 Muscle weakness (generalized): Secondary | ICD-10-CM | POA: Diagnosis not present

## 2022-08-10 DIAGNOSIS — M545 Low back pain, unspecified: Secondary | ICD-10-CM | POA: Diagnosis not present

## 2022-08-10 DIAGNOSIS — M47816 Spondylosis without myelopathy or radiculopathy, lumbar region: Secondary | ICD-10-CM | POA: Diagnosis not present

## 2022-08-10 DIAGNOSIS — M6281 Muscle weakness (generalized): Secondary | ICD-10-CM | POA: Diagnosis not present

## 2022-08-10 DIAGNOSIS — M2560 Stiffness of unspecified joint, not elsewhere classified: Secondary | ICD-10-CM | POA: Diagnosis not present

## 2022-08-15 DIAGNOSIS — M47816 Spondylosis without myelopathy or radiculopathy, lumbar region: Secondary | ICD-10-CM | POA: Diagnosis not present

## 2022-08-15 DIAGNOSIS — M545 Low back pain, unspecified: Secondary | ICD-10-CM | POA: Diagnosis not present

## 2022-08-15 DIAGNOSIS — M6281 Muscle weakness (generalized): Secondary | ICD-10-CM | POA: Diagnosis not present

## 2022-08-15 DIAGNOSIS — M2560 Stiffness of unspecified joint, not elsewhere classified: Secondary | ICD-10-CM | POA: Diagnosis not present

## 2022-08-19 DIAGNOSIS — M2560 Stiffness of unspecified joint, not elsewhere classified: Secondary | ICD-10-CM | POA: Diagnosis not present

## 2022-08-19 DIAGNOSIS — M545 Low back pain, unspecified: Secondary | ICD-10-CM | POA: Diagnosis not present

## 2022-08-19 DIAGNOSIS — M6281 Muscle weakness (generalized): Secondary | ICD-10-CM | POA: Diagnosis not present

## 2022-08-19 DIAGNOSIS — M47816 Spondylosis without myelopathy or radiculopathy, lumbar region: Secondary | ICD-10-CM | POA: Diagnosis not present

## 2022-08-22 DIAGNOSIS — M47816 Spondylosis without myelopathy or radiculopathy, lumbar region: Secondary | ICD-10-CM | POA: Diagnosis not present

## 2022-08-22 DIAGNOSIS — M545 Low back pain, unspecified: Secondary | ICD-10-CM | POA: Diagnosis not present

## 2022-08-22 DIAGNOSIS — M6281 Muscle weakness (generalized): Secondary | ICD-10-CM | POA: Diagnosis not present

## 2022-08-22 DIAGNOSIS — M2560 Stiffness of unspecified joint, not elsewhere classified: Secondary | ICD-10-CM | POA: Diagnosis not present

## 2022-09-05 ENCOUNTER — Other Ambulatory Visit (HOSPITAL_BASED_OUTPATIENT_CLINIC_OR_DEPARTMENT_OTHER): Payer: Self-pay

## 2022-09-05 MED ORDER — AREXVY 120 MCG/0.5ML IM SUSR
INTRAMUSCULAR | 0 refills | Status: DC
  Filled 2022-09-05: qty 0.5, 1d supply, fill #0

## 2022-09-18 DIAGNOSIS — N401 Enlarged prostate with lower urinary tract symptoms: Secondary | ICD-10-CM | POA: Diagnosis not present

## 2022-09-25 DIAGNOSIS — R3915 Urgency of urination: Secondary | ICD-10-CM | POA: Diagnosis not present

## 2022-09-25 DIAGNOSIS — N401 Enlarged prostate with lower urinary tract symptoms: Secondary | ICD-10-CM | POA: Diagnosis not present

## 2022-09-25 NOTE — Progress Notes (Signed)
Electrophysiology Office Note Date: 10/02/2022  ID:  Leonard Maldonado, DOB 01/20/1951, MRN [id number]  PCP: Wenda Low, MD Primary Cardiologist: None Electrophysiologist: Dr Rayann Heman - > Will Meredith Leeds, MD   CC: Pacemaker follow-up  Leonard Maldonado is a 71 y.o. male seen today for Will Meredith Leeds, MD for routine electrophysiology followup. Since last being seen in our clinic the patient reports doing well overall.  he denies chest pain, palpitations, dyspnea, PND, orthopnea, nausea, vomiting, dizziness, syncope, edema, weight gain, or early satiety.   Device History: MDT Micra AV PPM implanted 2020 for Mobitz II    Past Medical History:  Diagnosis Date   HTN (hypertension)    Hyperlipemia    Second degree AV block    Past Surgical History:  Procedure Laterality Date   COLONOSCOPY WITH PROPOFOL N/A 11/04/2015   Procedure: COLONOSCOPY WITH PROPOFOL;  Surgeon: Garlan Fair, MD;  [facility]: [facility];  Service: Endoscopy;  Laterality: N/A;   EXTRACORPOREAL SHOCK WAVE LITHOTRIPSY Left 10/10/2021   Procedure: EXTRACORPOREAL SHOCK WAVE LITHOTRIPSY (ESWL);  Surgeon: Lucas Mallow, MD;  Location: [facility];  Service: Urology;  Laterality: Left;   PACEMAKER LEADLESS INSERTION N/A 09/16/2019   Procedure: PACEMAKER LEADLESS INSERTION;  Surgeon: [name], MD;  Location: [place] [facility];  Service: Cardiovascular;  Laterality: N/A;   SHOULDER SURGERY      Current Outpatient Medications  Medication Sig Dispense Refill   acetaminophen (TYLENOL) 325 MG tablet Take 2 tablets (650 mg total) by mouth every 4 (four) hours as needed for headache or mild pain.     aspirin 81 MG tablet Take 81 mg by mouth 2 (two) times a week.      calcium carbonate (OS-CAL) 600 MG tablet daily at 6 (six) AM.     cholecalciferol (VITAMIN D) 1000 UNITS tablet Take 1,000 Units by mouth daily.     COENZYME Q-10 PO Take 1 tablet by mouth daily.     diclofenac sodium  (VOLTAREN) 1 % GEL Apply 2 g topically 4 (four) times daily.     finasteride (PROSCAR) 5 MG tablet Take 5 mg by mouth every evening.   3   HYDROcodone-acetaminophen (NORCO/VICODIN) 5-325 MG tablet Take 1 tablet by mouth every 4 (four) hours as needed for moderate pain. 10 tablet 0   latanoprost (XALATAN) 0.005 % ophthalmic solution Place 1 drop into both eyes at bedtime.  3   Magnesium 250 MG TABS Take 250 mg by mouth 2 (two) times a week.     Multiple Vitamin (MULTIVITAMIN) tablet Take 1 tablet by mouth daily.     Multiple Vitamins-Minerals (EMERGEN-C VITAMIN C) PACK Take 1 Dose by mouth 2 (two) times a week.     Omega-3 Fatty Acids (FISH OIL) 1200 MG CAPS Take 1,200 mg by mouth daily.      oxybutynin (DITROPAN-XL) 5 MG 24 hr tablet Take 5 mg by mouth daily.     pantoprazole (PROTONIX) 40 MG tablet Take 40 mg by mouth daily.     Probiotic Product (PROBIOTIC DAILY PO) Take 1 capsule by mouth 2 (two) times a week.      RSV vaccine recomb adjuvanted (AREXVY) 120 MCG/0.5ML injection Inject into the muscle. 0.5 mL 0   sildenafil (REVATIO) 20 MG tablet Take 20-40 mg by mouth as needed (for ED).      simvastatin (ZOCOR) 20 MG tablet Take 20 mg by mouth at bedtime.      tamsulosin (FLOMAX) 0.4 MG  CAPS capsule Take 0.4 mg by mouth 2 (two) times daily.   3   No current facility-administered medications for this visit.    Allergies:   Patient has no known allergies.   Social History: Social History   Socioeconomic History   Marital status: Married    Spouse name: Not on file   Number of children: Not on file   Years of education: Not on file   Highest education level: Not on file  Occupational History   Not on file  Tobacco Use   Smoking status: Former    Packs/day: 1.00    Years: 10.00    Total pack years: 10.00    Types: Cigarettes    Quit date: 03/18/1972    Years since quitting: 50.5   Smokeless tobacco: Never  Vaping Use   Vaping Use: Never used  Substance and Sexual Activity    Alcohol use: No    Alcohol/week: 0.0 standard drinks of alcohol   Drug use: No   Sexual activity: Not on file  Other Topics Concern   Not on file  Social History Narrative   Not on file   Social Determinants of Health   Financial Resource Strain: Not on file  Food Insecurity: Not on file  Transportation Needs: Not on file  Physical Activity: Not on file  Stress: Not on file  Social Connections: Not on file  Intimate Partner Violence: Not on file    Family History: Family History  Problem Relation Age of Onset   Emphysema Father    Lung cancer Father      Review of Systems: All other systems reviewed and are otherwise negative except as noted above.  Physical Exam: Vitals:   10/02/22 1210  BP: 124/76  Pulse: 82  SpO2: 97%  Weight: 222 lb (100.7 kg)  Height: '6\' 2"'$  (1.88 m)     GEN- The patient is well appearing, alert and oriented x 3 today.   HEENT: normocephalic, atraumatic; sclera clear, conjunctiva pink; hearing intact; oropharynx clear; neck supple, no JVP Lymph- no cervical lymphadenopathy Lungs- Clear to ausculation bilaterally, normal work of breathing.  No wheezes, rales, rhonchi Heart- Regular  rate and rhythm, no murmurs, rubs or gallops, PMI not laterally displaced GI- soft, non-tender, non-distended, bowel sounds present, no hepatosplenomegaly Extremities- no clubbing or cyanosis. No peripheral edema; DP/PT/radial pulses 2+ bilaterally MS- no significant deformity or atrophy Skin- warm and dry, no rash or lesion; PPM pocket well healed Psych- euthymic mood, full affect Neuro- strength and sensation are intact  PPM Interrogation-  reviewed in detail today,  See PACEART report.  EKG:  EKG is ordered today. Personal review of ekg ordered today shows NSR at 82 bpm (V pacing)   Recent Labs: No results found for requested labs within last 365 days.   Wt Readings from Last 3 Encounters:  10/02/22 222 lb (100.7 kg)  10/10/21 218 lb (98.9 kg)   09/30/21 224 lb (101.6 kg)     Other studies Reviewed: Additional studies/ records that were reviewed today include: Previous EP office notes, Previous remote checks, Most recent labwork.    Assessment and Plan:  1. Second Degree AV block s/p [employer]onic PPM (Micra) Normal PPM function See Pace Art report No changes today  2. HTN Stable on current regimen   3. DOE Echo 02/17/2021 LVEF 55-60% Myoview with no ischemia, small defects all consistent with artifact. EF normal. Low risk.  No undue SOB at this time. Says he can do "a lot  before he wears out."  Current medicines are reviewed at length with the patient today.    Labs/ tests ordered today include:  Orders Placed This Encounter  Procedures   EKG 12-Lead     Disposition:   Follow up with Dr. Curt Bears in 12 months    Signed, Shirley Friar, PA-C  10/02/2022 12:19 PM  [place] [address] [phone] (office) [phone] (fax)

## 2022-10-02 ENCOUNTER — Encounter: Payer: Self-pay | Admitting: Student

## 2022-10-02 ENCOUNTER — Ambulatory Visit: Payer: Medicare PPO | Attending: Student | Admitting: Student

## 2022-10-02 VITALS — BP 124/76 | HR 82 | Ht 74.0 in | Wt 222.0 lb

## 2022-10-02 DIAGNOSIS — I10 Essential (primary) hypertension: Secondary | ICD-10-CM | POA: Diagnosis not present

## 2022-10-02 DIAGNOSIS — R0602 Shortness of breath: Secondary | ICD-10-CM | POA: Diagnosis not present

## 2022-10-02 DIAGNOSIS — I442 Atrioventricular block, complete: Secondary | ICD-10-CM | POA: Diagnosis not present

## 2022-10-02 DIAGNOSIS — I441 Atrioventricular block, second degree: Secondary | ICD-10-CM

## 2022-10-02 LAB — CUP PACEART INCLINIC DEVICE CHECK
Battery Remaining Longevity: 87 mo
Battery Voltage: 2.99 V
Brady Statistic AS VP Percent: 83.17 %
Brady Statistic AS VS Percent: 0.26 %
Brady Statistic RV Percent Paced: 93.19 %
Date Time Interrogation Session: 20231120122058
Implantable Pulse Generator Implant Date: 20201103
Lead Channel Impedance Value: 560 Ohm
Lead Channel Pacing Threshold Amplitude: 0.5 V
Lead Channel Pacing Threshold Pulse Width: 0.24 ms
Lead Channel Sensing Intrinsic Amplitude: 16.988 mV
Lead Channel Setting Pacing Amplitude: 1 V
Lead Channel Setting Pacing Pulse Width: 0.24 ms
Lead Channel Setting Sensing Sensitivity: 2 mV

## 2022-10-02 NOTE — Patient Instructions (Signed)
Medication Instructions:  Your physician recommends that you continue on your current medications as directed. Please refer to the Current Medication list given to you today.  *If you need a refill on your cardiac medications before your next appointment, please call your pharmacy*   Lab Work: None If you have labs (blood work) drawn today and your tests are completely normal, you will receive your results only by: MyChart Message (if you have MyChart) OR A paper copy in the mail If you have any lab test that is abnormal or we need to change your treatment, we will call you to review the results.   Follow-Up: At [facility], you and your health needs are our priority.  As part of our continuing mission to provide you with exceptional heart care, we have created designated Provider Care Teams.  These Care Teams include your primary Cardiologist (physician) and Advanced Practice Providers (APPs -  Physician Assistants and Nurse Practitioners) who all work together to provide you with the care you need, when you need it.  Your next appointment:   1 year(s)  The format for your next appointment:   In Person  Provider:   Will Camnitz, MD     Important Information About Sugar       

## 2022-10-18 ENCOUNTER — Ambulatory Visit (INDEPENDENT_AMBULATORY_CARE_PROVIDER_SITE_OTHER): Payer: Medicare PPO

## 2022-10-18 DIAGNOSIS — I441 Atrioventricular block, second degree: Secondary | ICD-10-CM | POA: Diagnosis not present

## 2022-10-19 LAB — CUP PACEART REMOTE DEVICE CHECK
Battery Remaining Longevity: 82 mo
Battery Voltage: 2.99 V
Brady Statistic AS VP Percent: 86.42 %
Brady Statistic AS VS Percent: 0 %
Brady Statistic RV Percent Paced: 99.99 %
Date Time Interrogation Session: 20231206172100
Implantable Pulse Generator Implant Date: 20201103
Lead Channel Impedance Value: 510 Ohm
Lead Channel Pacing Threshold Amplitude: 0.5 V
Lead Channel Pacing Threshold Pulse Width: 0.24 ms
Lead Channel Sensing Intrinsic Amplitude: 16.988 mV
Lead Channel Setting Pacing Amplitude: 1.125
Lead Channel Setting Pacing Pulse Width: 0.24 ms
Lead Channel Setting Sensing Sensitivity: 2 mV

## 2022-10-26 DIAGNOSIS — H401131 Primary open-angle glaucoma, bilateral, mild stage: Secondary | ICD-10-CM | POA: Diagnosis not present

## 2022-11-10 NOTE — Progress Notes (Signed)
Remote pacemaker transmission.   

## 2022-11-20 DIAGNOSIS — N528 Other male erectile dysfunction: Secondary | ICD-10-CM | POA: Diagnosis not present

## 2022-11-20 DIAGNOSIS — R3915 Urgency of urination: Secondary | ICD-10-CM | POA: Diagnosis not present

## 2022-11-20 DIAGNOSIS — N401 Enlarged prostate with lower urinary tract symptoms: Secondary | ICD-10-CM | POA: Diagnosis not present

## 2022-11-29 DIAGNOSIS — N1831 Chronic kidney disease, stage 3a: Secondary | ICD-10-CM | POA: Diagnosis not present

## 2022-11-29 DIAGNOSIS — I10 Essential (primary) hypertension: Secondary | ICD-10-CM | POA: Diagnosis not present

## 2022-11-29 DIAGNOSIS — K219 Gastro-esophageal reflux disease without esophagitis: Secondary | ICD-10-CM | POA: Diagnosis not present

## 2022-11-29 DIAGNOSIS — N400 Enlarged prostate without lower urinary tract symptoms: Secondary | ICD-10-CM | POA: Diagnosis not present

## 2022-11-29 DIAGNOSIS — J040 Acute laryngitis: Secondary | ICD-10-CM | POA: Diagnosis not present

## 2022-11-29 DIAGNOSIS — I700 Atherosclerosis of aorta: Secondary | ICD-10-CM | POA: Diagnosis not present

## 2022-11-29 DIAGNOSIS — Z950 Presence of cardiac pacemaker: Secondary | ICD-10-CM | POA: Diagnosis not present

## 2022-11-29 DIAGNOSIS — E7800 Pure hypercholesterolemia, unspecified: Secondary | ICD-10-CM | POA: Diagnosis not present

## 2023-01-17 ENCOUNTER — Ambulatory Visit: Payer: Medicare PPO

## 2023-01-17 DIAGNOSIS — I441 Atrioventricular block, second degree: Secondary | ICD-10-CM | POA: Diagnosis not present

## 2023-01-17 LAB — CUP PACEART REMOTE DEVICE CHECK
Battery Remaining Longevity: 82 mo
Battery Voltage: 2.98 V
Brady Statistic AS VP Percent: 86.27 %
Brady Statistic AS VS Percent: 0 %
Brady Statistic RV Percent Paced: 99.99 %
Date Time Interrogation Session: 20240306143500
Implantable Pulse Generator Implant Date: 20201103
Lead Channel Impedance Value: 510 Ohm
Lead Channel Pacing Threshold Amplitude: 0.5 V
Lead Channel Pacing Threshold Pulse Width: 0.24 ms
Lead Channel Sensing Intrinsic Amplitude: 6.975 mV
Lead Channel Setting Pacing Amplitude: 1 V
Lead Channel Setting Pacing Pulse Width: 0.24 ms
Lead Channel Setting Sensing Sensitivity: 2 mV

## 2023-01-29 DIAGNOSIS — H401131 Primary open-angle glaucoma, bilateral, mild stage: Secondary | ICD-10-CM | POA: Diagnosis not present

## 2023-02-21 NOTE — Progress Notes (Signed)
Remote pacemaker transmission.   

## 2023-04-17 DIAGNOSIS — H401131 Primary open-angle glaucoma, bilateral, mild stage: Secondary | ICD-10-CM | POA: Diagnosis not present

## 2023-04-18 ENCOUNTER — Ambulatory Visit (INDEPENDENT_AMBULATORY_CARE_PROVIDER_SITE_OTHER): Payer: Medicare PPO

## 2023-04-18 DIAGNOSIS — I441 Atrioventricular block, second degree: Secondary | ICD-10-CM

## 2023-04-18 LAB — CUP PACEART REMOTE DEVICE CHECK
Battery Remaining Longevity: 80 mo
Battery Voltage: 2.98 V
Brady Statistic AS VP Percent: 86.09 %
Brady Statistic AS VS Percent: 0 %
Brady Statistic RV Percent Paced: 99.99 %
Date Time Interrogation Session: 20240605054600
Implantable Pulse Generator Implant Date: 20201103
Lead Channel Impedance Value: 520 Ohm
Lead Channel Pacing Threshold Amplitude: 0.625 V
Lead Channel Pacing Threshold Pulse Width: 0.24 ms
Lead Channel Sensing Intrinsic Amplitude: 24.3 mV
Lead Channel Setting Pacing Amplitude: 1.125
Lead Channel Setting Pacing Pulse Width: 0.24 ms
Lead Channel Setting Sensing Sensitivity: 2 mV

## 2023-05-07 DIAGNOSIS — R32 Unspecified urinary incontinence: Secondary | ICD-10-CM | POA: Diagnosis not present

## 2023-05-07 DIAGNOSIS — N529 Male erectile dysfunction, unspecified: Secondary | ICD-10-CM | POA: Diagnosis not present

## 2023-05-07 DIAGNOSIS — H409 Unspecified glaucoma: Secondary | ICD-10-CM | POA: Diagnosis not present

## 2023-05-07 DIAGNOSIS — M545 Low back pain, unspecified: Secondary | ICD-10-CM | POA: Diagnosis not present

## 2023-05-07 DIAGNOSIS — K219 Gastro-esophageal reflux disease without esophagitis: Secondary | ICD-10-CM | POA: Diagnosis not present

## 2023-05-07 DIAGNOSIS — I4430 Unspecified atrioventricular block: Secondary | ICD-10-CM | POA: Diagnosis not present

## 2023-05-07 DIAGNOSIS — N189 Chronic kidney disease, unspecified: Secondary | ICD-10-CM | POA: Diagnosis not present

## 2023-05-07 DIAGNOSIS — M1990 Unspecified osteoarthritis, unspecified site: Secondary | ICD-10-CM | POA: Diagnosis not present

## 2023-05-07 DIAGNOSIS — E785 Hyperlipidemia, unspecified: Secondary | ICD-10-CM | POA: Diagnosis not present

## 2023-05-14 NOTE — Progress Notes (Signed)
Remote pacemaker transmission.   

## 2023-06-12 DIAGNOSIS — N1831 Chronic kidney disease, stage 3a: Secondary | ICD-10-CM | POA: Diagnosis not present

## 2023-06-12 DIAGNOSIS — I10 Essential (primary) hypertension: Secondary | ICD-10-CM | POA: Diagnosis not present

## 2023-06-12 DIAGNOSIS — Z1331 Encounter for screening for depression: Secondary | ICD-10-CM | POA: Diagnosis not present

## 2023-06-12 DIAGNOSIS — J040 Acute laryngitis: Secondary | ICD-10-CM | POA: Diagnosis not present

## 2023-06-12 DIAGNOSIS — I700 Atherosclerosis of aorta: Secondary | ICD-10-CM | POA: Diagnosis not present

## 2023-06-12 DIAGNOSIS — N400 Enlarged prostate without lower urinary tract symptoms: Secondary | ICD-10-CM | POA: Diagnosis not present

## 2023-06-12 DIAGNOSIS — E7800 Pure hypercholesterolemia, unspecified: Secondary | ICD-10-CM | POA: Diagnosis not present

## 2023-06-12 DIAGNOSIS — M5136 Other intervertebral disc degeneration, lumbar region: Secondary | ICD-10-CM | POA: Diagnosis not present

## 2023-06-12 DIAGNOSIS — Z0000 Encounter for general adult medical examination without abnormal findings: Secondary | ICD-10-CM | POA: Diagnosis not present

## 2023-06-12 DIAGNOSIS — Z950 Presence of cardiac pacemaker: Secondary | ICD-10-CM | POA: Diagnosis not present

## 2023-06-12 DIAGNOSIS — Z23 Encounter for immunization: Secondary | ICD-10-CM | POA: Diagnosis not present

## 2023-06-27 DIAGNOSIS — H2511 Age-related nuclear cataract, right eye: Secondary | ICD-10-CM | POA: Diagnosis not present

## 2023-06-27 DIAGNOSIS — H401131 Primary open-angle glaucoma, bilateral, mild stage: Secondary | ICD-10-CM | POA: Diagnosis not present

## 2023-06-28 ENCOUNTER — Telehealth: Payer: Self-pay

## 2023-06-28 NOTE — Telephone Encounter (Signed)
   Pre-operative Risk Assessment    Patient Name: Leonard Maldonado  DOB: [birth year] MRN: [id number]      Request for Surgical Clearance    Procedure:   CAT OD WITH GONIOTOMY   Date of Surgery:  Clearance 08/07/23                                 Surgeon:  DR. Dione Booze Surgeon's Group or Practice Name:  [facility] Phone number:  [phone] Fax number:  [phone]   Type of Clearance Requested:   - Pharmacy:  Hold Aspirin NEEDS INSTRUCTIONS    Type of Anesthesia:  MAC   Additional requests/questions:    Signed, Michaelle Copas   06/28/2023, 2:15 PM

## 2023-06-28 NOTE — Telephone Encounter (Signed)
   Patient Name: Leonard Maldonado  DOB: 07/09/1951 MRN: [id number]  Primary Cardiologist: None  Chart reviewed as part of pre-operative protocol coverage. Cataract extractions are recognized in guidelines as low risk surgeries that do not typically require specific preoperative testing or holding of blood thinner therapy. Therefore, given past medical history and time since last visit, based on ACC/AHA guidelines, GLYNN NGAI would be at acceptable risk for the planned procedure without further cardiovascular testing.   He may proceed to surgery without further cardiac testing.  Patient's aspirin is not prescribed by cardiology provider.  Recommendations for holding aspirin will need to come from prescribing provider.  I will route this recommendation to the requesting party via Epic fax function and remove from pre-op pool.  Please call with questions.  Ronney Asters, NP 06/28/2023, 2:29 PM

## 2023-07-11 DIAGNOSIS — N1831 Chronic kidney disease, stage 3a: Secondary | ICD-10-CM | POA: Diagnosis not present

## 2023-07-17 DIAGNOSIS — H401131 Primary open-angle glaucoma, bilateral, mild stage: Secondary | ICD-10-CM | POA: Diagnosis not present

## 2023-07-18 ENCOUNTER — Ambulatory Visit (INDEPENDENT_AMBULATORY_CARE_PROVIDER_SITE_OTHER): Payer: Medicare PPO

## 2023-07-18 DIAGNOSIS — I441 Atrioventricular block, second degree: Secondary | ICD-10-CM | POA: Diagnosis not present

## 2023-07-18 LAB — CUP PACEART REMOTE DEVICE CHECK
Battery Remaining Longevity: 79 mo
Battery Voltage: 2.97 V
Brady Statistic AS VP Percent: 86.27 %
Brady Statistic AS VS Percent: 0 %
Brady Statistic RV Percent Paced: 99.99 %
Date Time Interrogation Session: 20240904061700
Implantable Pulse Generator Implant Date: 20201103
Lead Channel Impedance Value: 520 Ohm
Lead Channel Pacing Threshold Amplitude: 0.5 V
Lead Channel Pacing Threshold Pulse Width: 0.24 ms
Lead Channel Sensing Intrinsic Amplitude: 9.563 mV
Lead Channel Setting Pacing Amplitude: 1 V
Lead Channel Setting Pacing Pulse Width: 0.24 ms
Lead Channel Setting Sensing Sensitivity: 2 mV

## 2023-07-20 DIAGNOSIS — R3915 Urgency of urination: Secondary | ICD-10-CM | POA: Diagnosis not present

## 2023-07-20 DIAGNOSIS — N401 Enlarged prostate with lower urinary tract symptoms: Secondary | ICD-10-CM | POA: Diagnosis not present

## 2023-07-23 ENCOUNTER — Other Ambulatory Visit (HOSPITAL_BASED_OUTPATIENT_CLINIC_OR_DEPARTMENT_OTHER): Payer: Self-pay

## 2023-07-23 MED ORDER — INFLUENZA VAC A&B SURF ANT ADJ 0.5 ML IM SUSY
0.5000 mL | PREFILLED_SYRINGE | Freq: Once | INTRAMUSCULAR | 0 refills | Status: AC
  Filled 2023-07-23 (×4): qty 0.5, 1d supply, fill #0

## 2023-07-23 MED ORDER — COMIRNATY 30 MCG/0.3ML IM SUSY
0.3000 mL | PREFILLED_SYRINGE | Freq: Once | INTRAMUSCULAR | 0 refills | Status: AC
  Filled 2023-07-23: qty 0.3, 1d supply, fill #0

## 2023-07-24 ENCOUNTER — Other Ambulatory Visit (HOSPITAL_BASED_OUTPATIENT_CLINIC_OR_DEPARTMENT_OTHER): Payer: Self-pay

## 2023-07-25 DIAGNOSIS — L821 Other seborrheic keratosis: Secondary | ICD-10-CM | POA: Diagnosis not present

## 2023-07-25 DIAGNOSIS — Z08 Encounter for follow-up examination after completed treatment for malignant neoplasm: Secondary | ICD-10-CM | POA: Diagnosis not present

## 2023-07-25 DIAGNOSIS — Z85828 Personal history of other malignant neoplasm of skin: Secondary | ICD-10-CM | POA: Diagnosis not present

## 2023-07-25 DIAGNOSIS — L570 Actinic keratosis: Secondary | ICD-10-CM | POA: Diagnosis not present

## 2023-07-25 DIAGNOSIS — D692 Other nonthrombocytopenic purpura: Secondary | ICD-10-CM | POA: Diagnosis not present

## 2023-07-25 DIAGNOSIS — L814 Other melanin hyperpigmentation: Secondary | ICD-10-CM | POA: Diagnosis not present

## 2023-07-25 DIAGNOSIS — D225 Melanocytic nevi of trunk: Secondary | ICD-10-CM | POA: Diagnosis not present

## 2023-07-31 NOTE — Progress Notes (Signed)
Remote pacemaker transmission.   

## 2023-08-07 DIAGNOSIS — H2511 Age-related nuclear cataract, right eye: Secondary | ICD-10-CM | POA: Diagnosis not present

## 2023-08-07 DIAGNOSIS — H401111 Primary open-angle glaucoma, right eye, mild stage: Secondary | ICD-10-CM | POA: Diagnosis not present

## 2023-08-16 DIAGNOSIS — H2512 Age-related nuclear cataract, left eye: Secondary | ICD-10-CM | POA: Diagnosis not present

## 2023-08-21 DIAGNOSIS — H2512 Age-related nuclear cataract, left eye: Secondary | ICD-10-CM | POA: Diagnosis not present

## 2023-08-21 DIAGNOSIS — H401121 Primary open-angle glaucoma, left eye, mild stage: Secondary | ICD-10-CM | POA: Diagnosis not present

## 2023-08-21 DIAGNOSIS — H5703 Miosis: Secondary | ICD-10-CM | POA: Diagnosis not present

## 2023-09-10 DIAGNOSIS — R3915 Urgency of urination: Secondary | ICD-10-CM | POA: Diagnosis not present

## 2023-09-10 DIAGNOSIS — N401 Enlarged prostate with lower urinary tract symptoms: Secondary | ICD-10-CM | POA: Diagnosis not present

## 2023-09-18 ENCOUNTER — Ambulatory Visit: Payer: Medicare PPO | Attending: Cardiology | Admitting: Cardiology

## 2023-09-18 ENCOUNTER — Encounter: Payer: Self-pay | Admitting: Cardiology

## 2023-09-18 VITALS — BP 118/70 | HR 79 | Ht 74.0 in | Wt 208.4 lb

## 2023-09-18 DIAGNOSIS — I441 Atrioventricular block, second degree: Secondary | ICD-10-CM | POA: Diagnosis not present

## 2023-09-18 DIAGNOSIS — I10 Essential (primary) hypertension: Secondary | ICD-10-CM | POA: Diagnosis not present

## 2023-09-18 LAB — CUP PACEART INCLINIC DEVICE CHECK
Date Time Interrogation Session: 20241105163007
Implantable Pulse Generator Implant Date: 20201103

## 2023-09-18 NOTE — Progress Notes (Signed)
  Electrophysiology Office Note:   Date:  09/18/2023  ID:  [name], DOB 27-Mar-1951, MRN [id number]  Primary Cardiologist: None Electrophysiologist: Dhyan Noah Jorja Loa, MD      History of Present Illness:   Leonard Maldonado is a 72 y.o. male with h/o hypertension, hyperlipidemia, second-degree AV block seen today for routine electrophysiology followup.   Since last being seen in our clinic the patient reports doing overall well.  He has no awareness of pacing.  He does have some intermittent shortness of breath, but otherwise has been doing well without complaint.  he denies chest pain, palpitations, dyspnea, PND, orthopnea, nausea, vomiting, dizziness, syncope, edema, weight gain, or early satiety.   Review of systems complete and found to be negative unless listed in HPI.      EP Information / Studies Reviewed:    EKG is ordered today. Personal review as below.  EKG Interpretation Date/Time:  Tuesday September 18 2023 16:07:56 EST Ventricular Rate:  79 PR Interval:  220 QRS Duration:  148 QT Interval:  442 QTC Calculation: 506 R Axis:   -81  Text Interpretation: Atrial-sensed ventricular-paced rhythm with prolonged AV conduction When compared with ECG of 17-Feb-2021 09:12, No significant change since last tracing Confirmed by Cathie Bonnell ([id number]) on 09/18/2023 4:17:37 PM   PPM Interrogation-  reviewed in detail today,  See PACEART report.  Device History: Medtronic Leadless PPM implanted 2020 for Second Degree AV block  Risk Assessment/Calculations:              Physical Exam:   VS:  BP 118/70 (BP Location: Left Arm, Patient Position: Sitting, Cuff Size: Large)   Pulse 79   Ht 6\' 2"  (1.88 m)   Wt 208 lb 6.4 oz (94.5 kg)   SpO2 97%   BMI 26.76 kg/m    Wt Readings from Last 3 Encounters:  09/18/23 208 lb 6.4 oz (94.5 kg)  10/02/22 222 lb (100.7 kg)  10/10/21 218 lb (98.9 kg)     GEN: Well nourished, well developed in no acute distress NECK: No JVD; No carotid  bruits CARDIAC: Regular rate and rhythm, no murmurs, rubs, gallops RESPIRATORY:  Clear to auscultation without rales, wheezing or rhonchi  ABDOMEN: Soft, non-tender, non-distended EXTREMITIES:  No edema; No deformity   ASSESSMENT AND PLAN:    Second Degree AV block s/p Medtronic PPM  Normal PPM function See Pace Art report No changes today  2.  Hypertension: Well-controlled  Disposition:   Follow up with EP APP in 12 months  Signed, Teleshia Lemere Jorja Loa, MD

## 2023-09-18 NOTE — Patient Instructions (Signed)

## 2023-10-08 ENCOUNTER — Other Ambulatory Visit: Payer: Self-pay

## 2023-10-08 ENCOUNTER — Emergency Department (HOSPITAL_BASED_OUTPATIENT_CLINIC_OR_DEPARTMENT_OTHER)
Admission: EM | Admit: 2023-10-08 | Discharge: 2023-10-08 | Disposition: A | Discharge status: Home / Self Care | Payer: Medicare PPO | Attending: Emergency Medicine | Admitting: Emergency Medicine

## 2023-10-08 ENCOUNTER — Encounter (HOSPITAL_BASED_OUTPATIENT_CLINIC_OR_DEPARTMENT_OTHER): Payer: Self-pay | Admitting: Emergency Medicine

## 2023-10-08 ENCOUNTER — Other Ambulatory Visit (HOSPITAL_BASED_OUTPATIENT_CLINIC_OR_DEPARTMENT_OTHER): Payer: Self-pay

## 2023-10-08 DIAGNOSIS — L03116 Cellulitis of left lower limb: Secondary | ICD-10-CM | POA: Diagnosis not present

## 2023-10-08 DIAGNOSIS — Z7982 Long term (current) use of aspirin: Secondary | ICD-10-CM | POA: Diagnosis not present

## 2023-10-08 DIAGNOSIS — I10 Essential (primary) hypertension: Secondary | ICD-10-CM | POA: Insufficient documentation

## 2023-10-08 DIAGNOSIS — Z87891 Personal history of nicotine dependence: Secondary | ICD-10-CM | POA: Diagnosis not present

## 2023-10-08 DIAGNOSIS — Z4800 Encounter for change or removal of nonsurgical wound dressing: Secondary | ICD-10-CM | POA: Diagnosis present

## 2023-10-08 MED ORDER — MUPIROCIN 2 % EX OINT
1.0000 | TOPICAL_OINTMENT | Freq: Two times a day (BID) | CUTANEOUS | 0 refills | Status: DC
  Filled 2023-10-08: qty 22, 11d supply, fill #0

## 2023-10-08 MED ORDER — CEFADROXIL 500 MG PO CAPS
500.0000 mg | ORAL_CAPSULE | Freq: Two times a day (BID) | ORAL | 0 refills | Status: DC
  Filled 2023-10-08: qty 12, 6d supply, fill #0

## 2023-10-08 NOTE — ED Notes (Signed)
Pt given discharge instructions and reviewed prescriptions. Opportunities given for questions. Pt verbalizes understanding. Madi Bonfiglio R, RN 
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
## 2023-10-08 NOTE — Discharge Instructions (Addendum)
As discussed, recommend continue daily bandage changes and application of topical antibiotic ointment.  Will also send in oral antibiotics given concern for surrounding skin redness for cellulitis.  Will also send in wound referral for reevaluation of your wound.  Please not hesitate to return if you develop fever, worsening pain, redness or other abnormality we discussed.
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
## 2023-10-17 ENCOUNTER — Ambulatory Visit (INDEPENDENT_AMBULATORY_CARE_PROVIDER_SITE_OTHER): Payer: Medicare PPO

## 2023-10-17 DIAGNOSIS — I441 Atrioventricular block, second degree: Secondary | ICD-10-CM | POA: Diagnosis not present

## 2023-10-17 LAB — CUP PACEART REMOTE DEVICE CHECK
Battery Remaining Longevity: 78 mo
Battery Voltage: 2.96 V
Brady Statistic AS VP Percent: 82.28 %
Brady Statistic AS VS Percent: 0.01 %
Brady Statistic RV Percent Paced: 99.86 %
Date Time Interrogation Session: 20241204064800
Implantable Pulse Generator Implant Date: 20201103
Lead Channel Impedance Value: 530 Ohm
Lead Channel Pacing Threshold Amplitude: 0.5 V
Lead Channel Pacing Threshold Pulse Width: 0.24 ms
Lead Channel Sensing Intrinsic Amplitude: 4.838 mV
Lead Channel Setting Pacing Amplitude: 1 V
Lead Channel Setting Pacing Pulse Width: 0.24 ms
Lead Channel Setting Sensing Sensitivity: 2 mV

## 2023-10-27 ENCOUNTER — Other Ambulatory Visit: Payer: Self-pay

## 2023-10-27 ENCOUNTER — Emergency Department (HOSPITAL_BASED_OUTPATIENT_CLINIC_OR_DEPARTMENT_OTHER)
Admission: EM | Admit: 2023-10-27 | Discharge: 2023-10-27 | Disposition: A | Discharge status: Home / Self Care | Payer: Medicare PPO | Attending: Emergency Medicine | Admitting: Emergency Medicine

## 2023-10-27 DIAGNOSIS — Z79899 Other long term (current) drug therapy: Secondary | ICD-10-CM | POA: Diagnosis not present

## 2023-10-27 DIAGNOSIS — I10 Essential (primary) hypertension: Secondary | ICD-10-CM | POA: Insufficient documentation

## 2023-10-27 DIAGNOSIS — Z7982 Long term (current) use of aspirin: Secondary | ICD-10-CM | POA: Diagnosis not present

## 2023-10-27 DIAGNOSIS — R2242 Localized swelling, mass and lump, left lower limb: Secondary | ICD-10-CM | POA: Diagnosis present

## 2023-10-27 DIAGNOSIS — L03116 Cellulitis of left lower limb: Secondary | ICD-10-CM | POA: Insufficient documentation

## 2023-10-27 LAB — BASIC METABOLIC PANEL
Anion gap: 8 (ref 5–15)
BUN: 26 mg/dL — ABNORMAL HIGH (ref 8–23)
CO2: 28 mmol/L (ref 22–32)
Calcium: 8.6 mg/dL — ABNORMAL LOW (ref 8.9–10.3)
Chloride: 103 mmol/L (ref 98–111)
Creatinine, Ser: 1.66 mg/dL — ABNORMAL HIGH (ref 0.61–1.24)
GFR, Estimated: 43 mL/min — ABNORMAL LOW (ref >60)
Glucose, Bld: 120 mg/dL — ABNORMAL HIGH (ref 70–99)
Potassium: 3.8 mmol/L (ref 3.5–5.1)
Sodium: 139 mmol/L (ref 135–145)

## 2023-10-27 LAB — CBC
HCT: 40.9 % (ref 39.0–52.0)
Hemoglobin: 13.8 g/dL (ref 13.0–17.0)
MCH: 29.7 pg (ref 26.0–34.0)
MCHC: 33.7 g/dL (ref 30.0–36.0)
MCV: 88 fL (ref 80.0–100.0)
Platelets: 178 10*3/uL (ref 150–400)
RBC: 4.65 MIL/uL (ref 4.22–5.81)
RDW: 12.9 % (ref 11.5–15.5)
WBC: 7 10*3/uL (ref 4.0–10.5)
nRBC: 0 % (ref 0.0–0.2)

## 2023-10-27 MED ORDER — MUPIROCIN 2 % EX OINT: 1.0000 | TOPICAL_OINTMENT | Freq: Two times a day (BID) | CUTANEOUS | 0 refills | Status: DC

## 2023-10-27 MED ORDER — DEXTROSE 5 % IV SOLN
1500.0000 mg | Freq: Once | INTRAVENOUS | Status: AC
  Administered 2023-10-27: 1500 mg via INTRAVENOUS
  Filled 2023-10-27 (×2): qty 75

## 2023-10-27 NOTE — Discharge Instructions (Addendum)
As discussed, you were treated with 1 dose of IV antibiotics for treatment of skin infection.  This is the entire treatment course for cellulitis.  If you feel worsening redness, fever, worsening pain, please return to emergency department.  Otherwise, continue local wound care at home.  Recommend washing area gently with warm soapy water.  Will send you home with antibiotic ointment to place over area.  Keep upcoming appointment with wound center; will place referral again to see if you can be seen more quickly.  Your kidney function was also slightly elevated so recommend repeat blood work by your primary care for reassessment in a week or so.  Please do not hesitate to return to emergency department if there are worrisome signs and symptoms we discussed become apparent.

## 2023-10-27 NOTE — ED Notes (Signed)
ED Provider at bedside. 

## 2023-10-27 NOTE — ED Notes (Addendum)
ED Provider at bedside performing ultrasound.

## 2023-10-27 NOTE — ED Triage Notes (Signed)
Pt POV from home reporting increased L lower leg swelling and redness. Seen 11/25 for unhealed wound, given abx with no improvement.

## 2023-10-27 NOTE — ED Notes (Signed)
Reached out to [facility] main pharm and [facility] ED pharm. [facility] being prepared- courier to deliver ASAP- no ETA.

## 2023-10-27 NOTE — Progress Notes (Signed)
Pharmacy Note: [facility]) for Acute Bacterial Skin and Skin Structure Infection (ABSSSI) Patients to [facility] Discharge   Leonard Maldonado is a 72 y.o. male who presented to [facility] on 10/27/2023 with an ABSSSI.  Inclusion criteria:  Indication: Cellulitis  Patient has at least one SIRS criteria present: None - has failed oral abx, per ED provider   Exclusion criteria: Patient was evaluated and negative for hardware involvement, hypotension / shock, elevated lactate > 2 without other explanation, gram-negative infection risk factors (bites, water exposure, infection after trauma, infection after skin graft, burns, severe immunocompromise), necrotizing fasciitis possible / confirmed, known or suspected osteomyelitis or septic arthritis, endocarditis, diabetic foot infection, ischemic ulcers, post-operative wound infection, perirectal infection, need for drainage in the OR, hand / facial infections, injection drug users with  fever, bacteremia, pregnancy or breastfeeding, allergy related to antibiotics like vancomycin, known liver disease ( T.Bili > 2x ULN or AST/ALT > 3x ULN).  Wilburn Cornelia, PharmD, BCPS Clinical Pharmacist 10/27/2023 5:32 PM   Please refer to [facility] for pharmacy phone number
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
## 2023-10-30 ENCOUNTER — Encounter: Payer: Medicare PPO | Attending: Physician Assistant | Admitting: Physician Assistant

## 2023-10-30 DIAGNOSIS — Z87891 Personal history of nicotine dependence: Secondary | ICD-10-CM | POA: Insufficient documentation

## 2023-10-30 DIAGNOSIS — L97822 Non-pressure chronic ulcer of other part of left lower leg with fat layer exposed: Secondary | ICD-10-CM | POA: Diagnosis not present

## 2023-10-30 DIAGNOSIS — Z950 Presence of cardiac pacemaker: Secondary | ICD-10-CM | POA: Diagnosis not present

## 2023-10-30 DIAGNOSIS — I10 Essential (primary) hypertension: Secondary | ICD-10-CM | POA: Insufficient documentation

## 2023-10-30 DIAGNOSIS — I87332 Chronic venous hypertension (idiopathic) with ulcer and inflammation of left lower extremity: Secondary | ICD-10-CM | POA: Diagnosis not present

## 2023-10-30 DIAGNOSIS — I872 Venous insufficiency (chronic) (peripheral): Secondary | ICD-10-CM | POA: Diagnosis not present

## 2023-10-30 NOTE — Progress Notes (Signed)
Leonard Maldonado ([id number]) [id number] Page 1 of 5 Visit Report for 10/30/2023 Abuse Risk Screen Details Patient Name: Date of Service: Leonard Maldonado ID L. 10/30/2023 8:15 A M Medical Record Number: [id number] Patient Account Number: [id number] Date of Birth/Sex: Treating RN: Sep 11, 1951 (72 y.o. Male) Angelina Pih Primary Care Monika Chestang: Georgann Housekeeper Other Clinician: Referring Reagann Dolce: Treating Ibraheem Voris/Extender: Allen Derry RO BBINS, CO O PER Weeks in Treatment: 0 Abuse Risk Screen Items Answer ABUSE RISK SCREEN: Has anyone close to you tried to hurt or harm you recentlyo No Do you feel uncomfortable with anyone in your familyo No Has anyone forced you do things that you didnt want to doo No Electronic Signature(s) Signed: 10/30/2023 3:54:48 PM By: Angelina Pih Entered By: Angelina Pih on 10/30/2023 05:32:23 -------------------------------------------------------------------------------- Activities of Daily Living Details Patient Name: Date of Service: Leonard Maldonado ID L. 10/30/2023 8:15 A M Medical Record Number: [id number] Patient Account Number: [id number] Date of Birth/Sex: Treating RN: 06-08-1951 (72 y.o. Male) Angelina Pih Primary Care Khaleb Broz: Georgann Housekeeper Other Clinician: Referring Lovel Suazo: Treating Kamee Bobst/Extender: Allen Derry RO BBINS, CO O PER Weeks in Treatment: 0 Activities of Daily Living Items Answer Activities of Daily Living (Please select one for each item) Drive Automobile Completely Able T Medications ake Completely Able Use T elephone Completely Able Care for Appearance Completely Able Use T oilet Completely Able Bath / Shower Completely Able Dress Self Completely Able Feed Self Completely Able Walk Completely Able Get In / Out Bed Completely Able Housework Completely ASCENCION, SHIMANEK ([id number]) [id number] Page 2 of 5 Prepare Meals Completely Able Handle  Money Completely Able Shop for Self Completely Able Electronic Signature(s) Signed: 10/30/2023 3:54:48 PM By: Angelina Pih Entered By: Angelina Pih on 10/30/2023 05:32:53 -------------------------------------------------------------------------------- Education Screening Details Patient Name: Date of Service: Leonard Maldonado ID L. 10/30/2023 8:15 A M Medical Record Number: [id number] Patient Account Number: [id number] Date of Birth/Sex: Treating RN: 30-Mar-1951 (72 y.o. Male) Angelina Pih Primary Care Deagen Krass: Georgann Housekeeper Other Clinician: Referring Isela Stantz: Treating Keiland Pickering/Extender: Allen Derry RO BBINS, CO O PER Weeks in Treatment: 0 Primary Learner Assessed: Patient Learning Preferences/Education Level/Primary Language Learning Preference: Explanation, Demonstration, Video, Communication Board, Printed Material Preferred Language: English Cognitive Barrier Language Barrier: No Translator Needed: No Memory Deficit: No Emotional Barrier: No Cultural/Religious Beliefs Affecting Medical Care: No Physical Barrier Impaired Vision: No Impaired Hearing: No Decreased Hand dexterity: No Knowledge/Comprehension Knowledge Level: High Comprehension Level: High Ability to understand written instructions: High Ability to understand verbal instructions: High Motivation Anxiety Level: Calm Cooperation: Cooperative Education Importance: Acknowledges Need Interest in Health Problems: Asks Questions Perception: Coherent Willingness to Engage in Self-Management High Activities: Readiness to Engage in Self-Management High Activities: Electronic Signature(s) Signed: 10/30/2023 3:54:48 PM By: Angelina Pih Entered By: Angelina Pih on 10/30/2023 05:33:18 Leonard Maldonado ([id number]) [phone] [id number] Page 3 of 5 -------------------------------------------------------------------------------- Fall Risk Assessment Details Patient Name: Date of  Service: Leonard Maldonado ID L. 10/30/2023 8:15 A M Medical Record Number: [id number] Patient Account Number: [id number] Date of Birth/Sex: Treating RN: Dec 21, 1950 (72 y.o. Male) Angelina Pih Primary Care Kash Davie: Georgann Housekeeper Other Clinician: Referring Roston Grunewald: Treating Kamari Bilek/Extender: Allen Derry RO BBINS, CO O PER Weeks in Treatment: 0 Fall Risk Assessment Items Have you had 2 or more falls in the last 12 monthso 0 No Have you had any fall that resulted in injury in the last 12 monthso 0 No FALLS RISK SCREEN History of falling - immediate or  within 3 months 0 No Secondary diagnosis (Do you have 2 or more medical diagnoseso) 0 No Ambulatory aid None/bed rest/wheelchair/nurse 0 Yes Crutches/cane/walker 0 No Furniture 0 No Intravenous therapy Access/Saline/Heparin Lock 0 No Gait/Transferring Normal/ bed rest/ wheelchair 0 Yes Weak (short steps with or without shuffle, stooped but able to lift head while walking, may seek 0 No support from furniture) Impaired (short steps with shuffle, may have difficulty arising from chair, head down, impaired 0 No balance) Mental Status Oriented to own ability 0 Yes Electronic Signature(s) Signed: 10/30/2023 3:54:48 PM By: Angelina Pih Entered By: Angelina Pih on 10/30/2023 05:33:31 -------------------------------------------------------------------------------- Foot Assessment Details Patient Name: Date of Service: Leonard Maldonado ID L. 10/30/2023 8:15 A M Medical Record Number: [id number] Patient Account Number: [id number] Date of Birth/Sex: Treating RN: [birth year] (72 y.o. Male) Angelina Pih Primary Care Leonard Maldonado: Georgann Housekeeper Other Clinician: Referring Zyair Russi: Treating Glendon Dunwoody/Extender: Allen Derry RO BBINS, CO O PER Weeks in Treatment: 0 Foot Assessment Items Site Locations [place], [place] L ([id number]) [id number] Page 4 of 5 + = Sensation present, - = Sensation absent, C = Callus, U  = Ulcer R = Redness, W = Warmth, M = Maceration, PU = Pre-ulcerative lesion F = Fissure, S = Swelling, D = Dryness Assessment Right: Left: Other Deformity: No No Prior Foot Ulcer: No No Prior Amputation: No No Charcot Joint: No No Ambulatory Status: Ambulatory Without Help Gait: Steady Electronic Signature(s) Signed: 10/30/2023 3:54:48 PM By: Angelina Pih Entered By: Angelina Pih on 10/30/2023 05:40:05 -------------------------------------------------------------------------------- Nutrition Risk Screening Details Patient Name: Date of Service: Leonard [place], [name] Maldonado ID L. 10/30/2023 8:15 A M Medical Record Number: [id number] Patient Account Number: [id number] Date of Birth/Sex: Treating RN: [birth year] (72 y.o. Male) Angelina Pih Primary Care Iriel Nason: Georgann Housekeeper Other Clinician: Referring Joevon Holliman: Treating Cotton Beckley/Extender: Allen Derry RO BBINS, CO O PER Weeks in Treatment: 0 Height (in): 74 Weight (lbs): 195 Body Mass Index (BMI): 25 Nutrition Risk Screening Items Score Screening NUTRITION RISK SCREEN: I have an illness or condition that made me change the kind and/or amount of food I eat 0 No I eat fewer than two meals per day 0 No I eat few fruits and vegetables, or milk products 0 No I have three or more drinks of beer, liquor or wine almost every day 0 No I have tooth or mouth problems that make it hard for me to eat 0 No I don't always have enough money to buy the food I need 0 No JYQUAN, HARDER ([id number]) [phone] [id number] Page 5 of 5 I eat alone most of the time 0 No I take three or more different prescribed or over-the-counter drugs a day 0 No Without wanting to, I have lost or gained 10 pounds in the last six months 0 No I am not always physically able to shop, cook and/or feed myself 0 No Nutrition Protocols Good Risk Protocol 0 No interventions needed Moderate Risk Protocol High Risk Proctocol Risk Level: Good Risk Score:  0 Electronic Signature(s) Signed: 10/30/2023 3:54:48 PM By: Angelina Pih Entered By: Angelina Pih on 10/30/2023 05:33:41
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
## 2023-11-03 NOTE — Progress Notes (Signed)
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 (72 y.o. 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 cardiac pacemaker I10 Essential (primary) hypertension Facility Procedures : 7 CPT4 Code: 5176160 Description:  01601 - WOUND CARE VISIT-LEV 3 EST PT Modifier: Quantity: 1 : 3 CPT4 Code: 0932355 Description: 11042 - DEB SUBQ TISSUE 20 SQ CM/< ICD-10 Diagnosis Description L97.822 Non-pressure chronic ulcer of other part of left lower leg with fat layer expos Modifier: ed Quantity: 1 Physician Procedures : CPT4 Code Description Modifier [id number] WC PHYS LEVEL 3 NEW PT 25 ICD-10 Diagnosis Description I87.332 Chronic venous hypertension (idiopathic) with ulcer and inflammation of left lower extremity L97.822  Non-pressure chronic ulcer of other part of left  lower leg with fat layer exposed Z95.0 Presence of cardiac pacemaker I10 Essential (primary) hypertension Quantity: 1 : 11042 11042 - WC PHYS SUBQ TISS 20 SQ CM ICD-10 Diagnosis Description L97.822 Non-pressure chronic ulcer of other part of left lower leg with fat layer exposed Quantity: 1 Electronic Signature(s) Signed: 11/01/2023 8:55:17 AM By: Allen Derry PA-C Entered By: Allen Derry on 11/01/2023 05:55:17

## 2023-11-06 ENCOUNTER — Encounter: Payer: Self-pay | Admitting: Infectious Diseases

## 2023-11-06 ENCOUNTER — Encounter: Payer: Medicare PPO | Admitting: Physician Assistant

## 2023-11-06 DIAGNOSIS — Z950 Presence of cardiac pacemaker: Secondary | ICD-10-CM | POA: Diagnosis not present

## 2023-11-06 DIAGNOSIS — L97822 Non-pressure chronic ulcer of other part of left lower leg with fat layer exposed: Secondary | ICD-10-CM | POA: Diagnosis not present

## 2023-11-06 DIAGNOSIS — I10 Essential (primary) hypertension: Secondary | ICD-10-CM | POA: Diagnosis not present

## 2023-11-06 DIAGNOSIS — I872 Venous insufficiency (chronic) (peripheral): Secondary | ICD-10-CM | POA: Diagnosis not present

## 2023-11-06 DIAGNOSIS — Z87891 Personal history of nicotine dependence: Secondary | ICD-10-CM | POA: Diagnosis not present
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
## 2023-11-06 NOTE — Progress Notes (Addendum)
JAKIM, MAZZOLA ([id number]) [id number] Page 1 of 7 Visit Report for 11/06/2023 Chief Complaint Document Details Patient Name: Date of Service: Leonard Maldonado ID L. 11/06/2023 10:30 A M Medical Record Number: [id number] Patient Account Number: [id number] Date of Birth/Sex: Treating RN: Oct 26, 1951 (72 y.o. Leonard Maldonado Primary Care Provider: Georgann Housekeeper Other Clinician: Referring Provider: Treating Provider/Extender: Peterson Ao Weeks in Treatment: 1 Information Obtained from: Patient Chief Complaint Left LE ulcer Electronic Signature(s) Signed: 11/06/2023 10:39:50 AM By: Allen Derry PA-C Entered By: Allen Derry on 11/06/2023 07:39:50 -------------------------------------------------------------------------------- HPI Details Patient Name: Date of Service: Leonard Maldonado, Leonard V ID L. 11/06/2023 10:30 A M Medical Record Number: [id number] Patient Account Number: [id number] Date of Birth/Sex: Treating RN: [birth year] (72 y.o. Leonard Maldonado Primary Care Provider: Georgann Housekeeper Other Clinician: Referring Provider: Treating Provider/Extender: Peterson Ao Weeks in Treatment: 1 History of Present Illness Chronic/Inactive Conditions Condition 1: 10-30-2023 patient's left ABI was 1.22 here in the office and he appears to have sufficient blood flow to be able to heal his wound which is excellent news. HPI Description: 10-30-2023 upon evaluation today patient presents for initial evaluation in our clinic concerning issues that he has with a wound over the left anterior lower extremity. This occurred around the beginning of October 2024. The patient tells me that he really does not have any thing that he knows of where this was an injury area. He does have chronic swelling and he notes that this just seem to come up. Following the initiation however it is continued to be an ongoing issue for him. The patient does not have any signs  of active infection at this time which is good news. With that being said he is telling me that it is quite painful in some areas. Patient does have a pacemaker and does have hypertension but no other major medical problems. He has been using mupirocin 2 times a day on this area and was given Dalvance in the hospital x 1 dose as he did not want to stay for IV antibiotics. 11-06-23. Patient's wound currently is showing signs of improvement. I do believe the iodoflex over the past week has been helpful. Electronic Signature(s) Dixon, Kinnie Scales ([id number]) [id number] Page 2 of 7 Signed: 11/06/2023 3:46:23 PM By: Allen Derry PA-C Entered By: Allen Derry on 11/06/2023 12:27:29 -------------------------------------------------------------------------------- Burn Debridement: Small Details Patient Name: Date of Service: Leonard Maldonado, Leonard V ID L. 11/06/2023 10:30 A M Medical Record Number: [id number] Patient Account Number: [id number] Date of Birth/Sex: Treating RN: Feb 24, 1951 (72 y.o. Leonard Maldonado Primary Care Provider: Georgann Housekeeper Other Clinician: Referring Provider: Treating Provider/Extender: Peterson Ao Weeks in Treatment: 1 Procedure Performed for: Wound #1 Left Lower Leg Performed By: Physician Allen Derry, PA-C The following information was scribed by: Angelina Pih The information was scribed for: Allen Derry Post Procedure Diagnosis Same as Pre-procedure Notes CAUTION - Patient on medication that inhibits coagulation. Debridement Performed for Assessment: Wound #1 Left Lower Leg Performed by: Physician Clinician Allen Derry Scribed The following information was scribed by: Angelina Pih The following information was scribed for: Allen Derry Debridement Type: Debridement Severity of Tissue Pre Debridement: Fat layer exposed Level of Consciousness pre-procedure: Awake and Alert Pre-procedure Verification/Time-Out  Taken: Yes 11:30 Start Time: Pain Control: Lidocaine 4% Topical Solution Tissue and other material debrided: Viable Non-Viable Biofilm Blood Clots Bone Callus Cartilage Eschar Fascia Fat Fibrin/Exudate Hyper-granulation Joint Capsule Ligament Muscle Subcutaneous Skin: Dermis Skin: Epidermis  Slough T endon Other Other: calcium deposit Level: Skin/Subcutaneous Tissue Debridement Description: Excisional Instrument: Blade Curette Electrocautery Forceps Nippers Rongeur Scissors Other Specimen: Swab Tissue Culture None Bleeding: Moderate Leonard Maldonado, Leonard Maldonado ([id number]) [id number] Page 3 of 7 Hemostasis Achieved: Pressure End Time: Procedural Pain: Post Procedural Pain: Response to Treatment: Procedure was tolerated well Level of Consciousness post-procedure: Awake and Alert Open Post Debridement Measurements of T Wound otal Length: (cm) 2 Width: (cm) 1.5 Depth: (cm) 0.2 Volume: (cm) 0.471 Percent of Wound Bed Debrided: 100 % T Surface Area Debrided: (cm) otal 2.36 Character of Wound/Ulcer Post Debridement: Improved Requires Further Debridement Stable Severity of Tissue Post Debridement: Fat layer exposed Reference values from 11/06/2023 Wound Assessment Length: (cm) 2 Width: (cm) 1.5 Depth: (cm) 0.1 Area:(cm) 2.356 Volume:(cm) 0.236 Import Existing Debridement Details Import No Thanks Post Procedure Diagnosis Same as Pre Procedure Post Procedure Diagnosis - not same as Pre-procedure Close Notes Electronic Signature(s) Signed: 11/06/2023 11:53:39 AM By: Angelina Pih Entered By: Angelina Pih on 11/06/2023 08:38:42 -------------------------------------------------------------------------------- Physical Exam Details Patient Name: Date of Service: Leonard Maldonado, Leonard V ID L. 11/06/2023 10:30 A M Medical Record Number: [id number] Patient Account Number: [id number] Date of Birth/Sex: Treating RN: [birth year] (72 y.o. Leonard Maldonado Primary Care Provider: Georgann Housekeeper Other Clinician: Referring Provider: Treating Provider/Extender: Peterson Ao Weeks in Treatment: 1 Constitutional Well-nourished and well-hydrated in no acute distress. Respiratory normal breathing without difficulty. Psychiatric this patient is able to make decisions and demonstrates good insight into disease process. Alert and Oriented x 3. pleasant and cooperative. Leonard Maldonado, Leonard Maldonado ([id number]) [id number] Page 4 of 7 Notes Patient went bad they require debridement. He actually had some calcinosis noted in the proximal portion of the wound, which I did try to clean away. This was the only area that was bothering him. As far as pain was concerned. Whatsoever I completed debridement he did not have any residual pain, which was excellent news. The wound looks greatly improved. I think we're gonna switch over to a collagen dressing. Electronic Signature(s) Signed: 11/06/2023 3:46:23 PM By: Allen Derry PA-C Entered By: Allen Derry on 11/06/2023 12:28:19 -------------------------------------------------------------------------------- Physician Orders Details Patient Name: Date of Service: Leonard Maldonado, Leonard V ID L. 11/06/2023 10:30 A M Medical Record Number: [id number] Patient Account Number: [id number] Date of Birth/Sex: Treating RN: [birth year] (72 y.o. Leonard Maldonado Primary Care Provider: Georgann Housekeeper Other Clinician: Referring Provider: Treating Provider/Extender: Peterson Ao Weeks in Treatment: 1 The following information was scribed by: Angelina Pih The information was scribed for: Allen Derry Verbal / Phone Orders: No Diagnosis Coding ICD-10 Coding Code Description [phone] Chronic venous hypertension (idiopathic) with ulcer and inflammation of left lower extremity L97.822 Non-pressure chronic ulcer of other part of left lower leg with fat layer exposed Z95.0 Presence of  cardiac pacemaker I10 Essential (primary) hypertension Follow-up Appointments Return Appointment in 1 week. Bathing/ Shower/ Hygiene May shower with wound dressing protected with water repellent cover or cast protector. No tub bath. Anesthetic (Use 'Patient Medications' Section for Anesthetic Order Entry) Lidocaine applied to wound bed Edema Control - Orders / Instructions Elevate, Exercise Daily and A void Standing for Long Periods of Time. Elevate legs to the level of the heart and pump ankles as often as possible Elevate leg(s) parallel to the floor when sitting. DO YOUR BEST to sleep in the bed at night. DO NOT sleep in your recliner. Long hours of sitting in a recliner leads to swelling of the legs and/or potential  wounds on your backside. Wound Treatment Wound #1 - Lower Leg Wound Laterality: Left Cleanser: Soap and Water 3 x Per Week/15 Days Discharge Instructions: Gently cleanse wound with antibacterial soap, rinse and pat dry prior to dressing wounds Cleanser: Wound Cleanser 3 x Per Week/15 Days Discharge Instructions: Wash your hands with soap and water. Remove old dressing, discard into plastic bag and place into trash. Cleanse the wound with Wound Cleanser prior to applying a clean dressing using gauze sponges, not tissues or cotton balls. Do not scrub or use excessive force. Pat dry using gauze sponges, not tissue or cotton balls. Prim Dressing: Promogran Matrix 4.34 (in) 3 x Per Week/15 Days ary Discharge Instructions: Moisten w/normal saline or sterile water; Cover wound as directed. Do not remove from wound bed. Prim Dressing: Xeroform-HBD 2x2 (in/in) 3 x Per Week/15 Days ary Discharge Instructions: Apply Xeroform-HBD 2x2 (in/in) as directed Leonard Maldonado, Leonard Maldonado ([id number]) [id number] Page 5 of 7 Secondary Dressing: Zetuvit Plus 4x4 (in/in) 3 x Per Week/15 Days Compression Wrap: Urgo K2 Lite, two layer compression system, large 3 x Per Week/15  Days Electronic Signature(s) Signed: 11/06/2023 11:53:39 AM By: Angelina Pih Signed: 11/06/2023 3:46:23 PM By: Allen Derry PA-C Entered By: Angelina Pih on 11/06/2023 08:37:49 -------------------------------------------------------------------------------- Problem List Details Patient Name: Date of Service: Leonard Maldonado, Leonard V ID L. 11/06/2023 10:30 A M Medical Record Number: [id number] Patient Account Number: [id number] Date of Birth/Sex: Treating RN: [birth year] (72 y.o. Leonard Maldonado Primary Care Provider: Georgann Housekeeper Other Clinician: Referring Provider: Treating Provider/Extender: Peterson Ao Weeks in Treatment: 1 Active Problems ICD-10 Encounter Code Description Active Date MDM Diagnosis I87.332 Chronic venous hypertension (idiopathic) with ulcer and inflammation of left 10/30/2023 No Yes lower extremity L97.822 Non-pressure chronic ulcer of other part of left lower leg with fat layer 10/30/2023 No Yes exposed Z95.0 Presence of cardiac pacemaker 10/30/2023 No Yes I10 Essential (primary) hypertension 10/30/2023 No Yes Inactive Problems Resolved Problems Electronic Signature(s) Signed: 11/06/2023 10:39:46 AM By: Allen Derry PA-C Entered By: Allen Derry on 11/06/2023 07:39:46 Leonard Maldonado, Leonard Maldonado ([id number]) [id number] Page 6 of 7 -------------------------------------------------------------------------------- Progress Note Details Patient Name: Date of Service: Leonard Maldonado, [name] V ID L. 11/06/2023 10:30 A M Medical Record Number: [id number] Patient Account Number: [id number] Date of Birth/Sex: Treating RN: 1951/01/26 (72 y.o. Leonard Maldonado Primary Care Provider: Georgann Housekeeper Other Clinician: Referring Provider: Treating Provider/Extender: Peterson Ao Weeks in Treatment: 1 Subjective Chief Complaint Information obtained from Patient Left LE ulcer History of Present Illness (HPI) Chronic/Inactive  Condition: 10-30-2023 patient's left ABI was 1.22 here in the office and he appears to have sufficient blood flow to be able to heal his wound which is excellent news. 10-30-2023 upon evaluation today patient presents for initial evaluation in our clinic concerning issues that he has with a wound over the left anterior lower extremity. This occurred around the beginning of October 2024. The patient tells me that he really does not have any thing that he knows of where this was an injury area. He does have chronic swelling and he notes that this just seem to come up. Following the initiation however it is continued to be an ongoing issue for him. The patient does not have any signs of active infection at this time which is good news. With that being said he is telling me that it is quite painful in some areas. Patient does have a pacemaker and does have hypertension but no other major medical problems. He has  been using mupirocin 2 times a day on this area and was given Dalvance in the hospital x 1 dose as he did not want to stay for IV antibiotics. 11-06-23. Patient's wound currently is showing signs of improvement. I do believe the iodoflex over the past week has been helpful. Objective Constitutional Well-nourished and well-hydrated in no acute distress. Vitals Time Taken: 10:38 AM, Height: 74 in, Weight: 195 lbs, BMI: 25, Temperature: 97.9 F, Pulse: 62 bpm, Respiratory Rate: 18 breaths/min, Blood Pressure: 124/69 mmHg. Respiratory normal breathing without difficulty. Psychiatric this patient is able to make decisions and demonstrates good insight into disease process. Alert and Oriented x 3. pleasant and cooperative. General Notes: Patient went bad they require debridement. He actually had some calcinosis noted in the proximal portion of the wound, which I did try to clean away. This was the only area that was bothering him. As far as pain was concerned. Whatsoever I completed debridement he  did not have any residual pain, which was excellent news. The wound looks greatly improved. I think we're gonna switch over to a collagen dressing. Integumentary (Hair, Skin) Wound #1 status is Open. Original cause of wound was Gradually Appeared. The date acquired was: 08/14/2023. The wound has been in treatment 1 weeks. The wound is located on the Left Lower Leg. The wound measures 2cm length x 1.5cm width x 0.1cm depth; 2.356cm^2 area and 0.236cm^3 volume. There is Fat Layer (Subcutaneous Tissue) exposed. There is no tunneling or undermining noted. There is a medium amount of serosanguineous drainage noted. There is medium (34-66%) granulation within the wound bed. There is a medium (34-66%) amount of necrotic tissue within the wound bed including Adherent Slough. Assessment Active Problems ICD-10 Leonard Maldonado, Leonard Maldonado ([id number]) [id number] Page 7 of 7 Chronic venous hypertension (idiopathic) with ulcer and inflammation of left lower extremity Non-pressure chronic ulcer of other part of left lower leg with fat layer exposed Presence of cardiac pacemaker Essential (primary) hypertension Procedures Wound #1 Pre-procedure diagnosis of Wound #1 is a Venous Leg Ulcer located on the Left Lower Leg . An Burn Debridement: Small procedure was performed by Allen Derry, PA-C. Post procedure Diagnosis Wound #1: Same as Pre-Procedure Notes: CAUTION - Patient on medication that inhibits coagulation. Debridement Performed for Assessment: Wound #1 Left Lower Leg Performed by: Physician Clinician Allen Derry Scribed The following information was scribed by: Angelina Pih The following information was scribed for: Allen Derry Debridement Type: Debridement Severity of Tissue Pre Debridement: Fat layer exposed Level of Consciousness pre-procedure: Awake and Alert Pre- procedure Verification/Time-Out Taken: Yes 11:30 Start Time: Pain Control: Lidocaine 4% Topical Solution Tissue and  other material debrided: Viable Non-Viable Biofilm Blood Clots Bone Callus Cartilage Eschar Fascia Fat Fibrin/Exudate Hyper-granulation Joint Capsule Ligament Muscle Subcutaneous Skin: Dermis Skin: Epidermis Slough T endon Other Other: calcium deposit Level: Skin/Subcutaneous Tissue Debridement Description: Excisional Instrument: Blade Curette Electrocautery Forceps Nippers Rongeur Scissors Other Specimen: Swab Tissue Culture None Bleeding: Moderate Hemostasis Achieved: Pressure End Time: Procedural Pain: Post Procedural Pain: Response to Treatment: Procedure was tolerated well Level of Consciousness post-procedure: Awake and Alert Open Post Debridement Measurements of T Wound Length: (cm) 2 Width: (cm) 1.5 Depth: (cm) 0.2 Volume: (cm) 0.471 Percent of Wound Bed Debrided: 100 % otal T Surface Area Debrided: (cm) 2.36 Character of Wound/Ulcer Post Debridement: Improved Requires Further Debridement Stable Severity of Tissue Post otal Debridement: Fat layer exposed Reference values from 11/06/2023 Wound Assessment Length: (cm) 2 Width: (cm) 1.5 Depth: (cm) 0.1 Area:(cm) 2.356 Volume:(cm) 0.236  Import Existing Debridement Details Import No Thanks Post Procedure Diagnosis Same as Pre Procedure Post Procedure Diagnosis - not same as Pre-procedure Close Notes Plan Follow-up Appointments: Return Appointment in 1 week. Bathing/ Shower/ Hygiene: May shower with wound dressing protected with water repellent cover or cast protector. No tub bath. Anesthetic (Use 'Patient Medications' Section for Anesthetic Order Entry): Lidocaine applied to wound bed Edema Control - Orders / Instructions: Elevate, Exercise Daily and Avoid Standing for Long Periods of Time. Elevate legs to the level of the heart and pump ankles as often as possible Elevate leg(s) parallel to the floor when sitting. DO YOUR BEST to sleep in the bed at night. DO NOT sleep in your recliner. Long hours of sitting in a recliner leads to  swelling of the legs and/or potential wounds on your backside. WOUND #1: - Lower Leg Wound Laterality: Left Cleanser: Soap and Water 3 x Per Week/15 Days Discharge Instructions: Gently cleanse wound with antibacterial soap, rinse and pat dry prior to dressing wounds Cleanser: Wound Cleanser 3 x Per Week/15 Days Discharge Instructions: Wash your hands with soap and water. Remove old dressing, discard into plastic bag and place into trash. Cleanse the wound with Wound Cleanser prior to applying a clean dressing using gauze sponges, not tissues or cotton balls. Do not scrub or use excessive force. Pat dry using gauze sponges, not tissue or cotton balls. Prim Dressing: Promogran Matrix 4.34 (in) 3 x Per Week/15 Days ary Discharge Instructions: Moisten w/normal saline or sterile water; Cover wound as directed. Do not remove from wound bed. Prim Dressing: Xeroform-HBD 2x2 (in/in) 3 x Per Week/15 Days ary Discharge Instructions: Apply Xeroform-HBD 2x2 (in/in) as directed Secondary Dressing: Zetuvit Plus 4x4 (in/in) 3 x Per Week/15 Days Com pression Wrap: Urgo K2 Lite, two layer compression system, large 3 x Per Week/15 Days 1. I am Maldonado to recommend currently that we switch over to a collagen dressing. 2. I will also recommend that we continue with the compression wrap which I think has been beneficial. 3. The patient should continue to elevate his leg as much as possible to help with the edema control as well. We will see him back for a visit in one week. Electronic Signature(s) Signed: 11/06/2023 3:46:23 PM By: Allen Derry PA-C Entered By: Allen Derry on 11/06/2023 12:38:59

## 2023-11-08 NOTE — Telephone Encounter (Signed)
Appointment canceled.

## 2023-11-13 ENCOUNTER — Encounter: Payer: Medicare PPO | Admitting: Internal Medicine

## 2023-11-13 ENCOUNTER — Ambulatory Visit: Payer: Medicare PPO | Admitting: Infectious Diseases

## 2023-11-13 DIAGNOSIS — Z950 Presence of cardiac pacemaker: Secondary | ICD-10-CM | POA: Diagnosis not present

## 2023-11-13 DIAGNOSIS — I10 Essential (primary) hypertension: Secondary | ICD-10-CM | POA: Diagnosis not present

## 2023-11-13 DIAGNOSIS — L97822 Non-pressure chronic ulcer of other part of left lower leg with fat layer exposed: Secondary | ICD-10-CM | POA: Diagnosis not present

## 2023-11-13 DIAGNOSIS — I87332 Chronic venous hypertension (idiopathic) with ulcer and inflammation of left lower extremity: Secondary | ICD-10-CM | POA: Diagnosis not present

## 2023-11-13 DIAGNOSIS — Z87891 Personal history of nicotine dependence: Secondary | ICD-10-CM | POA: Diagnosis not present
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
## 2023-11-20 ENCOUNTER — Ambulatory Visit: Payer: Medicare PPO | Admitting: Physician Assistant

## 2023-11-20 ENCOUNTER — Encounter: Payer: Medicare PPO | Attending: Physician Assistant | Admitting: Physician Assistant

## 2023-11-20 DIAGNOSIS — Z950 Presence of cardiac pacemaker: Secondary | ICD-10-CM | POA: Diagnosis not present

## 2023-11-20 DIAGNOSIS — I87332 Chronic venous hypertension (idiopathic) with ulcer and inflammation of left lower extremity: Secondary | ICD-10-CM | POA: Diagnosis not present

## 2023-11-20 DIAGNOSIS — I10 Essential (primary) hypertension: Secondary | ICD-10-CM | POA: Diagnosis not present

## 2023-11-20 DIAGNOSIS — L97822 Non-pressure chronic ulcer of other part of left lower leg with fat layer exposed: Secondary | ICD-10-CM | POA: Insufficient documentation

## 2023-11-20 DIAGNOSIS — I872 Venous insufficiency (chronic) (peripheral): Secondary | ICD-10-CM | POA: Diagnosis not present
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
## 2023-11-27 ENCOUNTER — Encounter: Payer: Medicare PPO | Admitting: Physician Assistant

## 2023-11-27 DIAGNOSIS — L97822 Non-pressure chronic ulcer of other part of left lower leg with fat layer exposed: Secondary | ICD-10-CM | POA: Diagnosis not present

## 2023-11-27 DIAGNOSIS — I872 Venous insufficiency (chronic) (peripheral): Secondary | ICD-10-CM | POA: Diagnosis not present

## 2023-11-27 DIAGNOSIS — Z950 Presence of cardiac pacemaker: Secondary | ICD-10-CM | POA: Diagnosis not present

## 2023-11-27 DIAGNOSIS — I10 Essential (primary) hypertension: Secondary | ICD-10-CM | POA: Diagnosis not present

## 2023-11-27 DIAGNOSIS — I87332 Chronic venous hypertension (idiopathic) with ulcer and inflammation of left lower extremity: Secondary | ICD-10-CM | POA: Diagnosis not present

## 2023-11-28 NOTE — Progress Notes (Addendum)
 Leonard Maldonado ([id number]) [id number] Page 1 of 7 Visit Report for 11/27/2023 Chief Complaint Document Details Patient Name: Date of Service: Leonard Maldonado Leonard Maldonado ID L. 11/27/2023 7:30 A M Medical Record Number: [id number] Patient Account Number: [id number] Date of Birth/Sex: Treating RN: 1951-07-25 (73 y.o. Leonard Maldonado Leonard Maldonado Primary Care Provider: Husain, Karrar Other Clinician: Referring Provider: Treating Provider/Extender: Bethena Andre Ransom Ardell Weeks in Treatment: 4 Information Obtained from: Patient Chief Complaint Left LE ulcer Electronic Signature(s) Signed: 11/27/2023 8:02:09 AM By: Bethena Andre PA-C Entered By: Bethena Andre on 11/27/2023 08:02:09 -------------------------------------------------------------------------------- Debridement Details Patient Name: Date of Service: Leonard Maldonado, Leonard V ID L. 11/27/2023 7:30 A M Medical Record Number: [id number] Patient Account Number: [id number] Date of Birth/Sex: Treating RN: [birth year] (73 y.o. Leonard Maldonado Leonard Maldonado Primary Care Provider: Ransom Ardell Other Clinician: Referring Provider: Treating Provider/Extender: Bethena Andre Ransom Ardell Weeks in Treatment: 4 Debridement Performed for Assessment: Wound #1 Left Lower Leg Performed By: Physician Bethena Andre, PA-C The following information was scribed by: Leonard Maldonado The information was scribed for: Bethena Andre Debridement Type: Debridement Severity of Tissue Pre Debridement: Fat layer exposed Level of Consciousness (Pre-procedure): Awake and Alert Pre-procedure Verification/Time Out Yes - 08:09 Taken: Pain Control: Lidocaine  4% T opical Solution Percent of Wound Bed Debrided: 50% T Area Debrided (cm): otal 0.65 Tissue and other material debrided: Viable, Non-Viable, Slough, Subcutaneous, Slough Level: Skin/Subcutaneous Tissue Debridement Description: Excisional Instrument: Curette Bleeding: Moderate Hemostasis Achieved: Pressure ASH, MCELWAIN  ([id number]) [id number] Page 2 of 7 Response to Treatment: Procedure was tolerated well Level of Consciousness (Post- Awake and Alert procedure): Post Debridement Measurements of Total Wound Length: (cm) 1.5 Width: (cm) 1.1 Depth: (cm) 0.1 Volume: (cm) 0.13 Character of Wound/Ulcer Post Debridement: Stable Severity of Tissue Post Debridement: Fat layer exposed Post Procedure Diagnosis Same as Pre-procedure Electronic Signature(s) Signed: 11/27/2023 4:00:17 PM By: Leonard Maldonado Signed: 11/27/2023 4:44:02 PM By: Bethena Andre PA-C Entered By: Leonard Maldonado on 11/27/2023 08:10:41 -------------------------------------------------------------------------------- HPI Details Patient Name: Date of Service: Leonard Maldonado, Leonard V ID L. 11/27/2023 7:30 A M Medical Record Number: [id number] Patient Account Number: [id number] Date of Birth/Sex: Treating RN: 1951-05-08 (73 y.o. Leonard Maldonado Leonard Maldonado Primary Care Provider: Husain, Karrar Other Clinician: Referring Provider: Treating Provider/Extender: Bethena Andre Ransom Ardell Weeks in Treatment: 4 History of Present Illness Chronic/Inactive Conditions Condition 1: 10-30-2023 patient's left ABI was 1.22 here in the office and he appears to have sufficient blood flow to be able to heal his wound which is excellent news. HPI Description: 10-30-2023 upon evaluation today patient presents for initial evaluation in our clinic concerning issues that he has with a wound over the left anterior lower extremity. This occurred around the beginning of October 2024. The patient tells me that he really does not have any thing that he knows of where this was an injury area. He does have chronic swelling and he notes that this just seem to come up. Following the initiation however it is continued to be an ongoing issue for him. The patient does not have any signs of active infection at this time which is good news. With that being said he is  telling me that it is quite painful in some areas. Patient does have a pacemaker and does have hypertension but no other major medical problems. He has been using mupirocin  2 times a day on this area and was given Dalvance  in the hospital x 1 dose as he did not want to stay for IV antibiotics.  11-27-2023 upon evaluation today patient appears to be doing excellent in regard to his wound. In fact he is showing signs of really good improvement and very pleased in that regard. Fortunately I do not see any signs of infection at the moment and I really think that he is doing quite well. No fevers, chills, nausea, vomiting, or diarrhea. Electronic Signature(s) Signed: 11/27/2023 9:05:16 AM By: Bethena Ferraris PA-C Entered By: Bethena Ferraris on 11/27/2023 09:05:16 Leonard Maldonado ([id number]) [id number] Page 3 of 7 -------------------------------------------------------------------------------- Physical Exam Details Patient Name: Date of Service: Leonard MARYANNE Maldonado, [name] V ID L. 11/27/2023 7:30 A M Medical Record Number: [id number] Patient Account Number: [id number] Date of Birth/Sex: Treating RN: 01-Apr-1951 (73 y.o. Leonard Maldonado Leonard Maldonado Primary Care Provider: Husain, Karrar Other Clinician: Referring Provider: Treating Provider/Extender: Bethena Ferraris Ransom Ardell Weeks in Treatment: 4 Constitutional Well-nourished and well-hydrated in no acute distress. Respiratory normal breathing without difficulty. Psychiatric this patient is able to make decisions and demonstrates good insight into disease process. Alert and Oriented x 3. pleasant and cooperative. Notes Upon evaluation patient's wound bed did require sharp debridement today but this was actually significantly improved compared to previous the calcium deposit on the proximal portion of the wound was completely closed and overall extremely pleased with what I am seeing today. I am hopeful this represents a good shift for him towards  getting this completely closed as quickly as possible. Electronic Signature(s) Signed: 11/27/2023 9:05:43 AM By: Bethena Ferraris PA-C Entered By: Bethena Ferraris on 11/27/2023 09:05:42 -------------------------------------------------------------------------------- Physician Orders Details Patient Name: Date of Service: Leonard MARYANNE Maldonado, Leonard V ID L. 11/27/2023 7:30 A M Medical Record Number: [id number] Patient Account Number: [id number] Date of Birth/Sex: Treating RN: [birth year] (73 y.o. Leonard Maldonado Leonard Maldonado Primary Care Provider: Ransom Ardell Other Clinician: Referring Provider: Treating Provider/Extender: Bethena Ferraris Ransom Ardell Weeks in Treatment: 4 The following information was scribed by: Leonard Maldonado The information was scribed for: Bethena Ferraris Verbal / Phone Orders: No Diagnosis Coding ICD-10 Coding Code Description [phone] Chronic venous hypertension (idiopathic) with ulcer and inflammation of left lower extremity L97.822 Non-pressure chronic ulcer of other part of left lower leg with fat layer exposed Z95.0 Presence of cardiac pacemaker I10 Essential (primary) hypertension Leonard Maldonado, Leonard Maldonado ([id number]) [id number] Page 4 of 7 Follow-up Appointments Return Appointment in 1 week. Bathing/ Shower/ Hygiene May shower with wound dressing protected with water repellent cover or cast protector. No tub bath. Anesthetic (Use 'Patient Medications' Section for Anesthetic Order Entry) Lidocaine  applied to wound bed Edema Control - Orders / Instructions Elevate, Exercise Daily and A void Standing for Long Periods of Time. Elevate legs to the level of the heart and pump ankles as often as possible Elevate leg(s) parallel to the floor when sitting. DO YOUR BEST to sleep in the bed at night. DO NOT sleep in your recliner. Long hours of sitting in a recliner leads to swelling of the legs and/or potential wounds on your backside. Wound Treatment Wound #1 - Lower Leg Wound  Laterality: Left Cleanser: Soap and Water 3 x Per Week/15 Days Discharge Instructions: Gently cleanse wound with antibacterial soap, rinse and pat dry prior to dressing wounds Cleanser: Wound Cleanser 3 x Per Week/15 Days Discharge Instructions: Wash your hands with soap and water. Remove old dressing, discard into plastic bag and place into trash. Cleanse the wound with Wound Cleanser prior to applying a clean dressing using gauze sponges, not tissues or cotton balls. Do not scrub or use excessive force. Pat dry using  gauze sponges, not tissue or cotton balls. Topical: Gentamicin 3 x Per Week/15 Days Discharge Instructions: Apply as directed by provider. Prim Dressing: Hydrofera Blue Ready Transfer Foam, 2.5x2.5 (in/in) 3 x Per Week/15 Days ary Discharge Instructions: Apply Hydrofera Blue Ready to wound bed as directed Secondary Dressing: Zetuvit Plus 4x4 (in/in) 3 x Per Week/15 Days Compression Wrap: Urgo K2 Lite, two layer compression system, large 3 x Per Week/15 Days Electronic Signature(s) Signed: 11/27/2023 4:00:17 PM By: Leonard Maldonado Signed: 11/27/2023 4:44:02 PM By: Bethena Ferraris PA-C Entered By: Leonard Maldonado on 11/27/2023 08:21:26 -------------------------------------------------------------------------------- Problem List Details Patient Name: Date of Service: Leonard MARYANNE Maldonado, Leonard V ID L. 11/27/2023 7:30 A M Medical Record Number: [id number] Patient Account Number: [id number] Date of Birth/Sex: Treating RN: [birth year] (73 y.o. Leonard Maldonado Leonard Maldonado Primary Care Provider: Husain, Karrar Other Clinician: Referring Provider: Treating Provider/Extender: Bethena Ferraris Ransom Ardell Weeks in Treatment: 4 Active Problems ICD-10 Encounter Code Description Active Date MDM Diagnosis I87.332 Chronic venous hypertension (idiopathic) with ulcer and inflammation of left 10/30/2023 No Yes lower extremity Leonard Maldonado, Leonard Maldonado ([id number]) [id number] Page 5 of 7 [phone]  Non-pressure chronic ulcer of other part of left lower leg with fat layer 10/30/2023 No Yes exposed Z95.0 Presence of cardiac pacemaker 10/30/2023 No Yes I10 Essential (primary) hypertension 10/30/2023 No Yes Inactive Problems Resolved Problems Electronic Signature(s) Signed: 11/27/2023 8:02:07 AM By: Bethena Ferraris PA-C Entered By: Bethena Ferraris on 11/27/2023 08:02:07 -------------------------------------------------------------------------------- Progress Note Details Patient Name: Date of Service: Leonard Maldonado, Leonard V ID L. 11/27/2023 7:30 A M Medical Record Number: [id number] Patient Account Number: [id number] Date of Birth/Sex: Treating RN: September 29, 1951 (73 y.o. Leonard Maldonado Leonard Maldonado Primary Care Provider: Husain, Karrar Other Clinician: Referring Provider: Treating Provider/Extender: Bethena Ferraris Ransom Ardell Weeks in Treatment: 4 Subjective Chief Complaint Information obtained from Patient Left LE ulcer History of Present Illness (HPI) Chronic/Inactive Condition: 10-30-2023 patient's left ABI was 1.22 here in the office and he appears to have sufficient blood flow to be able to heal his wound which is excellent news. 10-30-2023 upon evaluation today patient presents for initial evaluation in our clinic concerning issues that he has with a wound over the left anterior lower extremity. This occurred around the beginning of October 2024. The patient tells me that he really does not have any thing that he knows of where this was an injury area. He does have chronic swelling and he notes that this just seem to come up. Following the initiation however it is continued to be an ongoing issue for him. The patient does not have any signs of active infection at this time which is good news. With that being said he is telling me that it is quite painful in some areas. Patient does have a pacemaker and does have hypertension but no other major medical problems. He has been using mupirocin  2 times a day on  this area and was given Dalvance  in the hospital x 1 dose as he did not want to stay for IV antibiotics. 11-27-2023 upon evaluation today patient appears to be doing excellent in regard to his wound. In fact he is showing signs of really good improvement and very pleased in that regard. Fortunately I do not see any signs of infection at the moment and I really think that he is doing quite well. No fevers, chills, nausea, vomiting, or diarrhea. Objective Leonard Maldonado, Leonard Maldonado ([id number]) [id number] Page 6 of 7 Constitutional Well-nourished and well-hydrated in no acute distress. Vitals Time Taken: 7:35 AM, Height: 74  in, Weight: 195 lbs, BMI: 25, Temperature: 97.5 F, Pulse: 62 bpm, Respiratory Rate: 18 breaths/min, Blood Pressure: 132/85 mmHg. Respiratory normal breathing without difficulty. Psychiatric this patient is able to make decisions and demonstrates good insight into disease process. Alert and Oriented x 3. pleasant and cooperative. General Notes: Upon evaluation patient's wound bed did require sharp debridement today but this was actually significantly improved compared to previous the calcium deposit on the proximal portion of the wound was completely closed and overall extremely pleased with what I am seeing today. I am hopeful this represents a good shift for him towards getting this completely closed as quickly as possible. Integumentary (Hair, Skin) Wound #1 status is Open. Original cause of wound was Gradually Appeared. The date acquired was: 08/14/2023. The wound has been in treatment 4 weeks. The wound is located on the Left Lower Leg. The wound measures 1.5cm length x 1.1cm width x 0.1cm depth; 1.296cm^2 area and 0.13cm^3 volume. There is Fat Layer (Subcutaneous Tissue) exposed. There is no tunneling or undermining noted. There is a medium amount of serosanguineous drainage noted. There is large (67-100%) granulation within the wound bed. There is a small  (1-33%) amount of necrotic tissue within the wound bed including Adherent Slough. Assessment Active Problems ICD-10 Chronic venous hypertension (idiopathic) with ulcer and inflammation of left lower extremity Non-pressure chronic ulcer of other part of left lower leg with fat layer exposed Presence of cardiac pacemaker Essential (primary) hypertension Procedures Wound #1 Pre-procedure diagnosis of Wound #1 is a Venous Leg Ulcer located on the Left Lower Leg .Severity of Tissue Pre Debridement is: Fat layer exposed. There was a Excisional Skin/Subcutaneous Tissue Debridement with a total area of 0.65 sq cm performed by Bethena Ferraris, PA-C. With the following instrument(s): Curette to remove Viable and Non-Viable tissue/material. Material removed includes Subcutaneous Tissue and Slough and after achieving pain control using Lidocaine  4% Topical Solution. No specimens were taken. A time out was conducted at 08:09, prior to the start of the procedure. A Moderate amount of bleeding was controlled with Pressure. The procedure was tolerated well. Post Debridement Measurements: 1.5cm length x 1.1cm width x 0.1cm depth; 0.13cm^3 volume. Character of Wound/Ulcer Post Debridement is stable. Severity of Tissue Post Debridement is: Fat layer exposed. Post procedure Diagnosis Wound #1: Same as Pre-Procedure Plan Follow-up Appointments: Return Appointment in 1 week. Bathing/ Shower/ Hygiene: May shower with wound dressing protected with water repellent cover or cast protector. No tub bath. Anesthetic (Use 'Patient Medications' Section for Anesthetic Order Entry): Lidocaine  applied to wound bed Edema Control - Orders / Instructions: Elevate, Exercise Daily and Avoid Standing for Long Periods of Time. Elevate legs to the level of the heart and pump ankles as often as possible Elevate leg(s) parallel to the floor when sitting. DO YOUR BEST to sleep in the bed at night. DO NOT sleep in your recliner. Long  hours of sitting in a recliner leads to swelling of the legs and/or potential wounds on your backside. WOUND #1: - Lower Leg Wound Laterality: Left Cleanser: Soap and Water 3 x Per Week/15 Days Discharge Instructions: Gently cleanse wound with antibacterial soap, rinse and pat dry prior to dressing wounds Cleanser: Wound Cleanser 3 x Per Week/15 Days Discharge Instructions: Wash your hands with soap and water. Remove old dressing, discard into plastic bag and place into trash. Cleanse the wound with Wound Cleanser prior to applying a clean dressing using gauze sponges, not tissues or cotton balls. Do not scrub or use excessive force.  Pat dry using gauze sponges, not tissue or cotton balls. Topical: Gentamicin 3 x Per Week/15 Days Discharge Instructions: Apply as directed by provider. Prim Dressing: Hydrofera Blue Ready Transfer Foam, 2.5x2.5 (in/in) 3 x Per Week/15 Days ary Discharge Instructions: Apply Hydrofera Blue Ready to wound bed as directed Secondary Dressing: Zetuvit Plus 4x4 (in/in) 3 x Per Week/15 Days Com pression Wrap: Urgo K2 Lite, two layer compression system, large 3 x Per Week/15 Days Leonard Maldonado, Leonard Maldonado ([id number]) [id number] Page 7 of 7 1. I would recommend the patient should continue to monitor for any signs of infection or worsening and if anything changes contact the office let me know. 2. I am going to recommended we initiate treatment with the Hydrofera Blue followed by the Zetuvit. We are still using the gentamicin cream as well. We will see patient back for reevaluation in 1 week here in the clinic. If anything worsens or changes patient will contact our office for additional recommendations. Electronic Signature(s) Signed: 11/27/2023 9:06:16 AM By: Bethena Ferraris PA-C Entered By: Bethena Ferraris on 11/27/2023 09:06:16 -------------------------------------------------------------------------------- SuperBill Details Patient Name: Date of  Service: Leonard Maldonado, Leonard V ID L. 11/27/2023 Medical Record Number: [id number] Patient Account Number: [id number] Date of Birth/Sex: Treating RN: May 21, 1951 (73 y.o. Leonard Maldonado Leonard Maldonado Primary Care Provider: Ransom Other Other Clinician: Referring Provider: Treating Provider/Extender: Bethena Ferraris Ransom Other Weeks in Treatment: 4 Diagnosis Coding ICD-10 Codes Code Description [phone] Chronic venous hypertension (idiopathic) with ulcer and inflammation of left lower extremity L97.822 Non-pressure chronic ulcer of other part of left lower leg with fat layer exposed Z95.0 Presence of cardiac pacemaker I10 Essential (primary) hypertension Facility Procedures : 3 CPT4 Code: [id number] Description: 11042 - DEB SUBQ TISSUE 20 SQ CM/< ICD-10 Diagnosis Description L97.822 Non-pressure chronic ulcer of other part of left lower leg with fat layer expos Modifier: ed Quantity: 1 Physician Procedures : CPT4 Code Description Modifier 11042 11042 - WC PHYS SUBQ TISS 20 SQ CM ICD-10 Diagnosis Description L97.822 Non-pressure chronic ulcer of other part of left lower leg with fat layer exposed Quantity: 1 Electronic Signature(s) Signed: 11/27/2023 11:07:43 AM By: Leonard Maldonado Signed: 11/27/2023 4:44:02 PM By: Bethena Ferraris PA-C Previous Signature: 11/27/2023 9:06:22 AM Version By: Bethena Ferraris PA-C Entered By: Leonard Maldonado on 11/27/2023 11:07:42
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
## 2023-12-04 ENCOUNTER — Encounter: Payer: Medicare PPO | Admitting: Physician Assistant

## 2023-12-04 DIAGNOSIS — L97822 Non-pressure chronic ulcer of other part of left lower leg with fat layer exposed: Secondary | ICD-10-CM | POA: Diagnosis not present

## 2023-12-04 DIAGNOSIS — I87332 Chronic venous hypertension (idiopathic) with ulcer and inflammation of left lower extremity: Secondary | ICD-10-CM | POA: Diagnosis not present

## 2023-12-04 DIAGNOSIS — I872 Venous insufficiency (chronic) (peripheral): Secondary | ICD-10-CM | POA: Diagnosis not present

## 2023-12-04 DIAGNOSIS — Z950 Presence of cardiac pacemaker: Secondary | ICD-10-CM | POA: Diagnosis not present

## 2023-12-04 DIAGNOSIS — I10 Essential (primary) hypertension: Secondary | ICD-10-CM | POA: Diagnosis not present
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
## 2023-12-04 NOTE — Progress Notes (Addendum)
Leonard Maldonado ([id number]) [id number] Page 1 of 8 Visit Report for 12/04/2023 Chief Complaint Document Details Patient Name: Date of Service: Leonard Maldonado ID L. 12/04/2023 7:30 A M Medical Record Number: [id number] Patient Account Number: [id number] Date of Birth/Sex: Treating RN: 24-Feb-1951 (73 y.o. Leonard Maldonado Primary Care Provider: Georgann Housekeeper Other Clinician: Referring Provider: Treating Provider/Extender: Peterson Ao Weeks in Treatment: 5 Information Obtained from: Patient Chief Complaint Left LE ulcer Electronic Signature(s) Signed: 12/04/2023 8:10:04 AM By: Allen Derry PA-C Entered By: Allen Derry on 12/04/2023 08:10:04 -------------------------------------------------------------------------------- Debridement Details Patient Name: Date of Service: Leonard Maldonado, [name] ID L. 12/04/2023 7:30 A M Medical Record Number: [id number] Patient Account Number: [id number] Date of Birth/Sex: Treating RN: 03-20-1951 (73 y.o. Leonard Maldonado Primary Care Provider: Georgann Housekeeper Other Clinician: Referring Provider: Treating Provider/Extender: Peterson Ao Weeks in Treatment: 5 Debridement Performed for Assessment: Wound #1 Left Lower Leg Performed By: Physician Allen Derry, PA-C The following information was scribed by: Angelina Pih The information was scribed for: Allen Derry Debridement Type: Debridement Severity of Tissue Pre Debridement: Fat layer exposed Level of Consciousness (Pre-procedure): Awake and Alert Pre-procedure Verification/Time Out Yes - 08:13 Taken: Pain Control: Lidocaine 4% T opical Solution Percent of Wound Bed Debrided: 100% T Area Debrided (cm): otal 0.94 Tissue and other material debrided: Viable, Non-Viable, Slough, Subcutaneous, Slough Level: Skin/Subcutaneous Tissue Debridement Description: Excisional Instrument: Curette Bleeding: Moderate Hemostasis Achieved: Pressure WALLER, MONTE  ([id number]) [id number] Page 2 of 8 Response to Treatment: Procedure was tolerated well Level of Consciousness (Post- Awake and Alert procedure): Post Debridement Measurements of Total Wound Length: (cm) 1.5 Width: (cm) 0.8 Depth: (cm) 0.1 Volume: (cm) 0.094 Character of Wound/Ulcer Post Debridement: Stable Severity of Tissue Post Debridement: Fat layer exposed Post Procedure Diagnosis Same as Pre-procedure Electronic Signature(s) Signed: 12/04/2023 4:29:01 PM By: Angelina Pih Signed: 12/04/2023 6:09:39 PM By: Allen Derry PA-C Entered By: Angelina Pih on 12/04/2023 08:15:39 -------------------------------------------------------------------------------- HPI Details Patient Name: Date of Service: Leonard Maldonado, [name] ID L. 12/04/2023 7:30 A M Medical Record Number: [id number] Patient Account Number: [id number] Date of Birth/Sex: Treating RN: October 19, 1951 (73 y.o. Leonard Maldonado Primary Care Provider: Georgann Housekeeper Other Clinician: Referring Provider: Treating Provider/Extender: Peterson Ao Weeks in Treatment: 5 History of Present Illness Chronic/Inactive Conditions Condition 1: 10-30-2023 patient's left ABI was 1.22 here in the office and he appears to have sufficient blood flow to be able to heal his wound which is excellent news. HPI Description: 10-30-2023 upon evaluation today patient presents for initial evaluation in our clinic concerning issues that he has with a wound over the left anterior lower extremity. This occurred around the beginning of October 2024. The patient tells me that he really does not have any thing that he knows of where this was an injury area. He does have chronic swelling and he notes that this just seem to come up. Following the initiation however it is continued to be an ongoing issue for him. The patient does not have any signs of active infection at this time which is good news. With that being said he is  telling me that it is quite painful in some areas. Patient does have a pacemaker and does have hypertension but no other major medical problems. He has been using mupirocin 2 times a day on this area and was given Dalvance in the hospital x 1 dose as he did not want to stay for IV antibiotics.  12-04-2023 upon evaluation today patient appears to be doing well currently in regard to his wound. He has been tolerating the dressing changes without complication. Fortunately I do not see any signs of infection and I do believe that the patient is making excellent headway here towards closure. Electronic Signature(s) Signed: 12/04/2023 6:08:02 PM By: Allen Derry PA-C Entered By: Allen Derry on 12/04/2023 18:08:02 Leonard Maldonado ([id number]) [id number] Page 3 of 8 -------------------------------------------------------------------------------- Physical Exam Details Patient Name: Date of Service: Leonard Maldonado ID L. 12/04/2023 7:30 A M Medical Record Number: [id number] Patient Account Number: [id number] Date of Birth/Sex: Treating RN: [birth year] (73 y.o. Leonard Maldonado Primary Care Provider: Georgann Housekeeper Other Clinician: Referring Provider: Treating Provider/Extender: Peterson Ao Weeks in Treatment: 5 Constitutional Well-nourished and well-hydrated in no acute distress. Respiratory normal breathing without difficulty. Psychiatric this patient is able to make decisions and demonstrates good insight into disease process. Alert and Oriented x 3. pleasant and cooperative. Notes Upon inspection patient's wound bed actually showed signs of need for sharp debridement although this was minimal postdebridement the wound bed actually appears to be doing significantly better which is great news and overall I do believe that we are making really good headway here towards closure. He still has a little bit of the callus deposit/buildup in the proximal portion of  the wound but again he has some skin covering over this as well which is excellent news. Electronic Signature(s) Signed: 12/04/2023 6:08:47 PM By: Allen Derry PA-C Entered By: Allen Derry on 12/04/2023 18:08:47 -------------------------------------------------------------------------------- Physician Orders Details Patient Name: Date of Service: Leonard Maldonado, [name] ID L. 12/04/2023 7:30 A M Medical Record Number: [id number] Patient Account Number: [id number] Date of Birth/Sex: Treating RN: Sep 26, 1951 (73 y.o. Leonard Maldonado Primary Care Provider: Georgann Housekeeper Other Clinician: Referring Provider: Treating Provider/Extender: Peterson Ao Weeks in Treatment: 5 The following information was scribed by: Angelina Pih The information was scribed for: Allen Derry Verbal / Phone Orders: No Diagnosis Coding ICD-10 Coding Code Description [phone] Chronic venous hypertension (idiopathic) with ulcer and inflammation of left lower extremity L97.822 Non-pressure chronic ulcer of other part of left lower leg with fat layer exposed Z95.0 Presence of cardiac pacemaker I10 Essential (primary) hypertension KAWON, CULVERHOUSE ([id number]) [id number] Page 4 of 8 Follow-up Appointments Return Appointment in 1 week. Bathing/ Shower/ Hygiene May shower with wound dressing protected with water repellent cover or cast protector. No tub bath. Anesthetic (Use 'Patient Medications' Section for Anesthetic Order Entry) Lidocaine applied to wound bed Edema Control - Orders / Instructions Elevate, Exercise Daily and A void Standing for Long Periods of Time. Elevate legs to the level of the heart and pump ankles as often as possible Elevate leg(s) parallel to the floor when sitting. DO YOUR BEST to sleep in the bed at night. DO NOT sleep in your recliner. Long hours of sitting in a recliner leads to swelling of the legs and/or potential wounds on your backside. Wound  Treatment Wound #1 - Lower Leg Wound Laterality: Left Cleanser: Soap and Water 3 x Per Week/15 Days Discharge Instructions: Gently cleanse wound with antibacterial soap, rinse and pat dry prior to dressing wounds Cleanser: Wound Cleanser 3 x Per Week/15 Days Discharge Instructions: Wash your hands with soap and water. Remove old dressing, discard into plastic bag and place into trash. Cleanse the wound with Wound Cleanser prior to applying a clean dressing using gauze sponges, not tissues or cotton balls. Do not scrub or  use excessive force. Pat dry using gauze sponges, not tissue or cotton balls. Topical: Gentamicin 3 x Per Week/15 Days Discharge Instructions: Apply as directed by provider. Topical: Triamcinolone Acetonide Cream, 0.1%, 15 (g) tube 3 x Per Week/15 Days Discharge Instructions: TOP OF LEG FOR ITCHING Apply as directed by provider. Prim Dressing: Hydrofera Blue Ready Transfer Foam, 2.5x2.5 (in/in) 3 x Per Week/15 Days ary Discharge Instructions: Apply Hydrofera Blue Ready to wound bed as directed Secondary Dressing: ABD Pad 5x9 (in/in) 3 x Per Week/15 Days Discharge Instructions: Cover with ABD pad Compression Wrap: Urgo K2 Lite, two layer compression system, large 3 x Per Week/15 Days Electronic Signature(s) Signed: 12/04/2023 4:29:01 PM By: Angelina Pih Signed: 12/04/2023 6:09:39 PM By: Allen Derry PA-C Entered By: Angelina Pih on 12/04/2023 08:26:07 -------------------------------------------------------------------------------- Problem List Details Patient Name: Date of Service: Leonard Maldonado, [name] ID L. 12/04/2023 7:30 A M Medical Record Number: [id number] Patient Account Number: [id number] Date of Birth/Sex: Treating RN: 1951/03/20 (73 y.o. Leonard Maldonado Primary Care Provider: Georgann Housekeeper Other Clinician: Referring Provider: Treating Provider/Extender: Peterson Ao Weeks in Treatment: 5 Active Problems ICD-10 Encounter Code Description Active  Date MDM Diagnosis I87.332 Chronic venous hypertension (idiopathic) with ulcer and inflammation of left 10/30/2023 No Yes ABRAHAM, BITZER ([id number]) [id number] Page 5 of 8 lower extremity [id number] Non-pressure chronic ulcer of other part of left lower leg with fat layer 10/30/2023 No Yes exposed Z95.0 Presence of cardiac pacemaker 10/30/2023 No Yes I10 Essential (primary) hypertension 10/30/2023 No Yes Inactive Problems Resolved Problems Electronic Signature(s) Signed: 12/04/2023 8:09:59 AM By: Allen Derry PA-C Entered By: Allen Derry on 12/04/2023 08:09:59 -------------------------------------------------------------------------------- Progress Note Details Patient Name: Date of Service: [place] Wallace Going, [name] ID L. 12/04/2023 7:30 A M Medical Record Number: [id number] Patient Account Number: [id number] Date of Birth/Sex: Treating RN: [birth year] (73 y.o. Leonard Maldonado Primary Care Provider: Georgann Housekeeper Other Clinician: Referring Provider: Treating Provider/Extender: Peterson Ao Weeks in Treatment: 5 Subjective Chief Complaint Information obtained from Patient Left LE ulcer History of Present Illness (HPI) Chronic/Inactive Condition: 10-30-2023 patient's left ABI was 1.22 here in the office and he appears to have sufficient blood flow to be able to heal his wound which is excellent news. 10-30-2023 upon evaluation today patient presents for initial evaluation in our clinic concerning issues that he has with a wound over the left anterior lower extremity. This occurred around the beginning of October 2024. The patient tells me that he really does not have any thing that he knows of where this was an injury area. He does have chronic swelling and he notes that this just seem to come up. Following the initiation however it is continued to be an ongoing issue for him. The patient does not have any signs of active infection at this time which is  good news. With that being said he is telling me that it is quite painful in some areas. Patient does have a pacemaker and does have hypertension but no other major medical problems. He has been using mupirocin 2 times a day on this area and was given Dalvance in the hospital x 1 dose as he did not want to stay for IV antibiotics. 12-04-2023 upon evaluation today patient appears to be doing well currently in regard to his wound. He has been tolerating the dressing changes without complication. Fortunately I do not see any signs of infection and I do believe that the patient is making excellent headway here towards closure.  Leonard Maldonado ([id number]) [id number] Page 6 of 8 Objective Constitutional Well-nourished and well-hydrated in no acute distress. Vitals Time Taken: 7:43 AM, Height: 74 in, Weight: 195 lbs, BMI: 25, Temperature: 97.9 F, Pulse: 62 bpm, Respiratory Rate: 18 breaths/min, Blood Pressure: 145/82 mmHg. Respiratory normal breathing without difficulty. Psychiatric this patient is able to make decisions and demonstrates good insight into disease process. Alert and Oriented x 3. pleasant and cooperative. General Notes: Upon inspection patient's wound bed actually showed signs of need for sharp debridement although this was minimal postdebridement the wound bed actually appears to be doing significantly better which is great news and overall I do believe that we are making really good headway here towards closure. He still has a little bit of the callus deposit/buildup in the proximal portion of the wound but again he has some skin covering over this as well which is excellent news. Integumentary (Hair, Skin) Wound #1 status is Open. Original cause of wound was Gradually Appeared. The date acquired was: 08/14/2023. The wound has been in treatment 5 weeks. The wound is located on the Left Lower Leg. The wound measures 1.5cm length x 0.8cm width x 0.1cm depth;  0.942cm^2 area and 0.094cm^3 volume. There is Fat Layer (Subcutaneous Tissue) exposed. There is no tunneling or undermining noted. There is a medium amount of serosanguineous drainage noted. There is large (67-100%) Maldonado granulation within the wound bed. There is a small (1-33%) amount of necrotic tissue within the wound bed including Adherent Slough. Assessment Active Problems ICD-10 Chronic venous hypertension (idiopathic) with ulcer and inflammation of left lower extremity Non-pressure chronic ulcer of other part of left lower leg with fat layer exposed Presence of cardiac pacemaker Essential (primary) hypertension Procedures Wound #1 Pre-procedure diagnosis of Wound #1 is a Venous Leg Ulcer located on the Left Lower Leg .Severity of Tissue Pre Debridement is: Fat layer exposed. There was a Excisional Skin/Subcutaneous Tissue Debridement with a total area of 0.94 sq cm performed by Allen Derry, PA-C. With the following instrument(s): Curette to remove Viable and Non-Viable tissue/material. Material removed includes Subcutaneous Tissue and Slough and after achieving pain control using Lidocaine 4% Topical Solution. No specimens were taken. A time out was conducted at 08:13, prior to the start of the procedure. A Moderate amount of bleeding was controlled with Pressure. The procedure was tolerated well. Post Debridement Measurements: 1.5cm length x 0.8cm width x 0.1cm depth; 0.094cm^3 volume. Character of Wound/Ulcer Post Debridement is stable. Severity of Tissue Post Debridement is: Fat layer exposed. Post procedure Diagnosis Wound #1: Same as Pre-Procedure Pre-procedure diagnosis of Wound #1 is a Venous Leg Ulcer located on the Left Lower Leg . There was a Double Layer Compression Therapy Procedure by Angelina Pih, RN. Post procedure Diagnosis Wound #1: Same as Pre-Procedure Plan Follow-up Appointments: Return Appointment in 1 week. Bathing/ Shower/ Hygiene: May shower with wound  dressing protected with water repellent cover or cast protector. No tub bath. Anesthetic (Use 'Patient Medications' Section for Anesthetic Order Entry): Lidocaine applied to wound bed Edema Control - Orders / Instructions: Elevate, Exercise Daily and Avoid Standing for Long Periods of Time. Elevate legs to the level of the heart and pump ankles as often as possible Elevate leg(s) parallel to the floor when sitting. DO YOUR BEST to sleep in the bed at night. DO NOT sleep in your recliner. Long hours of sitting in a recliner leads to swelling of the legs and/or potential wounds on your backside. WOUND #1: - Lower Leg Wound Laterality:  Left Cleanser: Soap and Water 3 x Per Week/15 Days Discharge Instructions: Gently cleanse wound with antibacterial soap, rinse and pat dry prior to dressing wounds Cleanser: Wound Cleanser 3 x Per Week/15 Days REYDEN, BALDERSTON ([id number]) [id number] Page 7 of 8 Discharge Instructions: Wash your hands with soap and water. Remove old dressing, discard into plastic bag and place into trash. Cleanse the wound with Wound Cleanser prior to applying a clean dressing using gauze sponges, not tissues or cotton balls. Do not scrub or use excessive force. Pat dry using gauze sponges, not tissue or cotton balls. Topical: Gentamicin 3 x Per Week/15 Days Discharge Instructions: Apply as directed by provider. Topical: Triamcinolone Acetonide Cream, 0.1%, 15 (g) tube 3 x Per Week/15 Days Discharge Instructions: TOP OF LEG FOR ITCHING Apply as directed by provider. Prim Dressing: Hydrofera Blue Ready Transfer Foam, 2.5x2.5 (in/in) 3 x Per Week/15 Days ary Discharge Instructions: Apply Hydrofera Blue Ready to wound bed as directed Secondary Dressing: ABD Pad 5x9 (in/in) 3 x Per Week/15 Days Discharge Instructions: Cover with ABD pad Com pression Wrap: Urgo K2 Lite, two layer compression system, large 3 x Per Week/15 Days 1. I am going to recommend  that we have the patient going and continue to monitor for any signs of infection or worsening. Based on what I am seeing I think he is making good headway here we will continue with the [facility] for now because of the backorder on the [facility] we may have to make a switch on this shortly. 2. I am good recommend as well that the patient should continue to elevate his legs much as possible also using the compression wrap for now and this seems to be doing quite well. This is the Urgo K2 light compression wrap. We will see patient back for reevaluation in 1 week here in the clinic. If anything worsens or changes patient will contact our office for additional recommendations. Electronic Signature(s) Signed: 12/04/2023 6:09:14 PM By: Allen Derry PA-C Entered By: Allen Derry on 12/04/2023 18:09:14 -------------------------------------------------------------------------------- SuperBill Details Patient Name: Date of Service: [place] Wallace Going, [name] ID L. 12/04/2023 Medical Record Number: [id number] Patient Account Number: [id number] Date of Birth/Sex: Treating RN: 13-May-1951 (73 y.o. Leonard Maldonado Primary Care Provider: Georgann Housekeeper Other Clinician: Referring Provider: Treating Provider/Extender: Peterson Ao Weeks in Treatment: 5 Diagnosis Coding ICD-10 Codes Code Description [phone] Chronic venous hypertension (idiopathic) with ulcer and inflammation of left lower extremity L97.822 Non-pressure chronic ulcer of other part of left lower leg with fat layer exposed Z95.0 Presence of cardiac pacemaker I10 Essential (primary) hypertension Facility Procedures : 3 CPT4 Code: [id number] Description: 11042 - DEB SUBQ TISSUE 20 SQ CM/< ICD-10 Diagnosis Description L97.822 Non-pressure chronic ulcer of other part of left lower leg with fat layer expos Modifier: ed Quantity: 1 Physician Procedures Electronic Signature(s) Signed: 12/04/2023 6:09:21 PM By: Allen Derry PA-C Entered  By: Allen Derry on 12/04/2023 18:09:21

## 2023-12-11 ENCOUNTER — Encounter: Payer: Medicare PPO | Admitting: Physician Assistant

## 2023-12-11 DIAGNOSIS — I87332 Chronic venous hypertension (idiopathic) with ulcer and inflammation of left lower extremity: Secondary | ICD-10-CM | POA: Diagnosis not present

## 2023-12-11 DIAGNOSIS — I872 Venous insufficiency (chronic) (peripheral): Secondary | ICD-10-CM | POA: Diagnosis not present

## 2023-12-11 DIAGNOSIS — Z950 Presence of cardiac pacemaker: Secondary | ICD-10-CM | POA: Diagnosis not present

## 2023-12-11 DIAGNOSIS — I10 Essential (primary) hypertension: Secondary | ICD-10-CM | POA: Diagnosis not present

## 2023-12-11 DIAGNOSIS — L97822 Non-pressure chronic ulcer of other part of left lower leg with fat layer exposed: Secondary | ICD-10-CM | POA: Diagnosis not present

## 2023-12-12 DIAGNOSIS — I87332 Chronic venous hypertension (idiopathic) with ulcer and inflammation of left lower extremity: Secondary | ICD-10-CM | POA: Diagnosis not present

## 2023-12-12 DIAGNOSIS — L97822 Non-pressure chronic ulcer of other part of left lower leg with fat layer exposed: Secondary | ICD-10-CM | POA: Diagnosis not present

## 2023-12-12 DIAGNOSIS — I10 Essential (primary) hypertension: Secondary | ICD-10-CM | POA: Diagnosis not present

## 2023-12-13 DIAGNOSIS — I10 Essential (primary) hypertension: Secondary | ICD-10-CM | POA: Diagnosis not present

## 2023-12-13 DIAGNOSIS — N400 Enlarged prostate without lower urinary tract symptoms: Secondary | ICD-10-CM | POA: Diagnosis not present

## 2023-12-13 DIAGNOSIS — S81801A Unspecified open wound, right lower leg, initial encounter: Secondary | ICD-10-CM | POA: Diagnosis not present

## 2023-12-13 DIAGNOSIS — N1831 Chronic kidney disease, stage 3a: Secondary | ICD-10-CM | POA: Diagnosis not present

## 2023-12-13 DIAGNOSIS — E7800 Pure hypercholesterolemia, unspecified: Secondary | ICD-10-CM | POA: Diagnosis not present

## 2023-12-13 DIAGNOSIS — I700 Atherosclerosis of aorta: Secondary | ICD-10-CM | POA: Diagnosis not present

## 2023-12-13 DIAGNOSIS — M47816 Spondylosis without myelopathy or radiculopathy, lumbar region: Secondary | ICD-10-CM | POA: Diagnosis not present

## 2023-12-13 DIAGNOSIS — Z950 Presence of cardiac pacemaker: Secondary | ICD-10-CM | POA: Diagnosis not present

## 2023-12-13 DIAGNOSIS — J040 Acute laryngitis: Secondary | ICD-10-CM | POA: Diagnosis not present

## 2023-12-18 ENCOUNTER — Ambulatory Visit (HOSPITAL_BASED_OUTPATIENT_CLINIC_OR_DEPARTMENT_OTHER): Payer: Medicare PPO | Admitting: General Surgery

## 2023-12-18 ENCOUNTER — Encounter: Payer: Medicare PPO | Attending: Physician Assistant | Admitting: Physician Assistant

## 2023-12-18 DIAGNOSIS — L97822 Non-pressure chronic ulcer of other part of left lower leg with fat layer exposed: Secondary | ICD-10-CM | POA: Insufficient documentation

## 2023-12-18 DIAGNOSIS — I10 Essential (primary) hypertension: Secondary | ICD-10-CM | POA: Diagnosis not present

## 2023-12-18 DIAGNOSIS — I87332 Chronic venous hypertension (idiopathic) with ulcer and inflammation of left lower extremity: Secondary | ICD-10-CM | POA: Diagnosis not present

## 2023-12-18 DIAGNOSIS — I872 Venous insufficiency (chronic) (peripheral): Secondary | ICD-10-CM | POA: Diagnosis not present

## 2023-12-18 DIAGNOSIS — Z950 Presence of cardiac pacemaker: Secondary | ICD-10-CM | POA: Diagnosis not present

## 2023-12-25 ENCOUNTER — Encounter: Payer: Medicare PPO | Admitting: Physician Assistant

## 2023-12-25 DIAGNOSIS — I87332 Chronic venous hypertension (idiopathic) with ulcer and inflammation of left lower extremity: Secondary | ICD-10-CM | POA: Diagnosis not present

## 2023-12-25 DIAGNOSIS — I10 Essential (primary) hypertension: Secondary | ICD-10-CM | POA: Diagnosis not present

## 2023-12-25 DIAGNOSIS — L97822 Non-pressure chronic ulcer of other part of left lower leg with fat layer exposed: Secondary | ICD-10-CM | POA: Diagnosis not present

## 2023-12-25 DIAGNOSIS — Z950 Presence of cardiac pacemaker: Secondary | ICD-10-CM | POA: Diagnosis not present

## 2023-12-25 DIAGNOSIS — I872 Venous insufficiency (chronic) (peripheral): Secondary | ICD-10-CM | POA: Diagnosis not present

## 2023-12-27 DIAGNOSIS — H401131 Primary open-angle glaucoma, bilateral, mild stage: Secondary | ICD-10-CM | POA: Diagnosis not present

## 2023-12-31 ENCOUNTER — Other Ambulatory Visit (HOSPITAL_BASED_OUTPATIENT_CLINIC_OR_DEPARTMENT_OTHER): Payer: Self-pay

## 2024-01-01 ENCOUNTER — Ambulatory Visit: Payer: Medicare PPO | Admitting: Physician Assistant

## 2024-01-08 ENCOUNTER — Encounter: Payer: Medicare PPO | Admitting: Physician Assistant

## 2024-01-08 DIAGNOSIS — L97822 Non-pressure chronic ulcer of other part of left lower leg with fat layer exposed: Secondary | ICD-10-CM | POA: Diagnosis not present

## 2024-01-08 DIAGNOSIS — I87332 Chronic venous hypertension (idiopathic) with ulcer and inflammation of left lower extremity: Secondary | ICD-10-CM | POA: Diagnosis not present

## 2024-01-08 DIAGNOSIS — Z950 Presence of cardiac pacemaker: Secondary | ICD-10-CM | POA: Diagnosis not present

## 2024-01-08 DIAGNOSIS — I10 Essential (primary) hypertension: Secondary | ICD-10-CM | POA: Diagnosis not present

## 2024-01-09 DIAGNOSIS — B351 Tinea unguium: Secondary | ICD-10-CM | POA: Diagnosis not present

## 2024-01-09 DIAGNOSIS — L570 Actinic keratosis: Secondary | ICD-10-CM | POA: Diagnosis not present

## 2024-01-09 DIAGNOSIS — L814 Other melanin hyperpigmentation: Secondary | ICD-10-CM | POA: Diagnosis not present

## 2024-01-09 DIAGNOSIS — L821 Other seborrheic keratosis: Secondary | ICD-10-CM | POA: Diagnosis not present

## 2024-01-09 DIAGNOSIS — Z08 Encounter for follow-up examination after completed treatment for malignant neoplasm: Secondary | ICD-10-CM | POA: Diagnosis not present

## 2024-01-09 DIAGNOSIS — Z85828 Personal history of other malignant neoplasm of skin: Secondary | ICD-10-CM | POA: Diagnosis not present

## 2024-01-09 DIAGNOSIS — D225 Melanocytic nevi of trunk: Secondary | ICD-10-CM | POA: Diagnosis not present

## 2024-01-16 ENCOUNTER — Ambulatory Visit (INDEPENDENT_AMBULATORY_CARE_PROVIDER_SITE_OTHER): Payer: Medicare PPO

## 2024-01-16 DIAGNOSIS — I441 Atrioventricular block, second degree: Secondary | ICD-10-CM | POA: Diagnosis not present

## 2024-01-18 LAB — CUP PACEART REMOTE DEVICE CHECK
Battery Remaining Longevity: 75 mo
Battery Voltage: 2.96 V
Brady Statistic AS VP Percent: 84.96 %
Brady Statistic AS VS Percent: 0.01 %
Brady Statistic RV Percent Paced: 99.92 %
Date Time Interrogation Session: 20250301094800
Implantable Pulse Generator Implant Date: 20201103
Lead Channel Impedance Value: 540 Ohm
Lead Channel Pacing Threshold Amplitude: 0.5 V
Lead Channel Pacing Threshold Pulse Width: 0.24 ms
Lead Channel Sensing Intrinsic Amplitude: 9.45 mV
Lead Channel Setting Pacing Amplitude: 1 V
Lead Channel Setting Pacing Pulse Width: 0.24 ms
Lead Channel Setting Sensing Sensitivity: 2 mV

## 2024-01-22 ENCOUNTER — Encounter: Payer: Medicare PPO | Attending: Physician Assistant | Admitting: Physician Assistant

## 2024-01-22 DIAGNOSIS — I10 Essential (primary) hypertension: Secondary | ICD-10-CM | POA: Diagnosis not present

## 2024-01-22 DIAGNOSIS — I87332 Chronic venous hypertension (idiopathic) with ulcer and inflammation of left lower extremity: Secondary | ICD-10-CM | POA: Diagnosis not present

## 2024-01-22 DIAGNOSIS — L97822 Non-pressure chronic ulcer of other part of left lower leg with fat layer exposed: Secondary | ICD-10-CM | POA: Diagnosis not present

## 2024-01-22 DIAGNOSIS — L97821 Non-pressure chronic ulcer of other part of left lower leg limited to breakdown of skin: Secondary | ICD-10-CM | POA: Diagnosis not present

## 2024-01-22 DIAGNOSIS — I872 Venous insufficiency (chronic) (peripheral): Secondary | ICD-10-CM | POA: Diagnosis not present

## 2024-01-22 DIAGNOSIS — Z950 Presence of cardiac pacemaker: Secondary | ICD-10-CM | POA: Insufficient documentation

## 2024-01-23 DIAGNOSIS — I87332 Chronic venous hypertension (idiopathic) with ulcer and inflammation of left lower extremity: Secondary | ICD-10-CM | POA: Diagnosis not present

## 2024-01-23 DIAGNOSIS — L97822 Non-pressure chronic ulcer of other part of left lower leg with fat layer exposed: Secondary | ICD-10-CM | POA: Diagnosis not present

## 2024-01-23 DIAGNOSIS — I10 Essential (primary) hypertension: Secondary | ICD-10-CM | POA: Diagnosis not present

## 2024-01-28 ENCOUNTER — Other Ambulatory Visit (HOSPITAL_BASED_OUTPATIENT_CLINIC_OR_DEPARTMENT_OTHER): Payer: Self-pay

## 2024-01-29 DIAGNOSIS — Q559 Congenital malformation of male genital organ, unspecified: Secondary | ICD-10-CM | POA: Diagnosis not present

## 2024-01-29 DIAGNOSIS — I10 Essential (primary) hypertension: Secondary | ICD-10-CM | POA: Diagnosis not present

## 2024-01-29 DIAGNOSIS — L089 Local infection of the skin and subcutaneous tissue, unspecified: Secondary | ICD-10-CM | POA: Diagnosis not present

## 2024-02-05 ENCOUNTER — Ambulatory Visit: Admitting: Physician Assistant

## 2024-02-07 ENCOUNTER — Encounter: Admitting: Internal Medicine

## 2024-02-07 DIAGNOSIS — I87302 Chronic venous hypertension (idiopathic) without complications of left lower extremity: Secondary | ICD-10-CM | POA: Diagnosis not present

## 2024-02-07 DIAGNOSIS — Z950 Presence of cardiac pacemaker: Secondary | ICD-10-CM | POA: Diagnosis not present

## 2024-02-07 DIAGNOSIS — I87332 Chronic venous hypertension (idiopathic) with ulcer and inflammation of left lower extremity: Secondary | ICD-10-CM | POA: Diagnosis not present

## 2024-02-07 DIAGNOSIS — I10 Essential (primary) hypertension: Secondary | ICD-10-CM | POA: Diagnosis not present

## 2024-02-07 DIAGNOSIS — L97822 Non-pressure chronic ulcer of other part of left lower leg with fat layer exposed: Secondary | ICD-10-CM | POA: Diagnosis not present

## 2024-02-15 ENCOUNTER — Encounter: Payer: Self-pay | Admitting: Cardiology

## 2024-02-19 ENCOUNTER — Encounter: Attending: Physician Assistant | Admitting: Physician Assistant

## 2024-02-19 DIAGNOSIS — Z950 Presence of cardiac pacemaker: Secondary | ICD-10-CM | POA: Insufficient documentation

## 2024-02-19 DIAGNOSIS — L97822 Non-pressure chronic ulcer of other part of left lower leg with fat layer exposed: Secondary | ICD-10-CM | POA: Diagnosis not present

## 2024-02-19 DIAGNOSIS — I87302 Chronic venous hypertension (idiopathic) without complications of left lower extremity: Secondary | ICD-10-CM | POA: Diagnosis not present

## 2024-02-19 DIAGNOSIS — I10 Essential (primary) hypertension: Secondary | ICD-10-CM | POA: Insufficient documentation

## 2024-02-19 DIAGNOSIS — Z09 Encounter for follow-up examination after completed treatment for conditions other than malignant neoplasm: Secondary | ICD-10-CM | POA: Diagnosis not present

## 2024-02-19 DIAGNOSIS — I87332 Chronic venous hypertension (idiopathic) with ulcer and inflammation of left lower extremity: Secondary | ICD-10-CM | POA: Diagnosis not present

## 2024-02-19 DIAGNOSIS — Z872 Personal history of diseases of the skin and subcutaneous tissue: Secondary | ICD-10-CM | POA: Insufficient documentation

## 2024-03-04 ENCOUNTER — Ambulatory Visit: Admitting: Physician Assistant

## 2024-03-04 NOTE — Addendum Note (Signed)
 Addended by: Lott Rouleau A on: 03/04/2024 01:27 PM   Modules accepted: Orders

## 2024-03-04 NOTE — Progress Notes (Signed)
 Remote pacemaker transmission.

## 2024-03-06 ENCOUNTER — Observation Stay (HOSPITAL_BASED_OUTPATIENT_CLINIC_OR_DEPARTMENT_OTHER)
Admission: EM | Admit: 2024-03-06 | Discharge: 2024-03-07 | Disposition: A | Discharge status: Home / Self Care | Attending: Internal Medicine | Admitting: Internal Medicine

## 2024-03-06 ENCOUNTER — Other Ambulatory Visit: Payer: Self-pay

## 2024-03-06 ENCOUNTER — Encounter (HOSPITAL_BASED_OUTPATIENT_CLINIC_OR_DEPARTMENT_OTHER): Payer: Self-pay

## 2024-03-06 ENCOUNTER — Emergency Department (HOSPITAL_BASED_OUTPATIENT_CLINIC_OR_DEPARTMENT_OTHER)

## 2024-03-06 DIAGNOSIS — K219 Gastro-esophageal reflux disease without esophagitis: Secondary | ICD-10-CM | POA: Insufficient documentation

## 2024-03-06 DIAGNOSIS — I700 Atherosclerosis of aorta: Secondary | ICD-10-CM | POA: Diagnosis not present

## 2024-03-06 DIAGNOSIS — E785 Hyperlipidemia, unspecified: Secondary | ICD-10-CM | POA: Insufficient documentation

## 2024-03-06 DIAGNOSIS — Z79899 Other long term (current) drug therapy: Secondary | ICD-10-CM | POA: Insufficient documentation

## 2024-03-06 DIAGNOSIS — L97921 Non-pressure chronic ulcer of unspecified part of left lower leg limited to breakdown of skin: Secondary | ICD-10-CM | POA: Insufficient documentation

## 2024-03-06 DIAGNOSIS — Z7902 Long term (current) use of antithrombotics/antiplatelets: Secondary | ICD-10-CM | POA: Diagnosis not present

## 2024-03-06 DIAGNOSIS — R29818 Other symptoms and signs involving the nervous system: Secondary | ICD-10-CM | POA: Diagnosis not present

## 2024-03-06 DIAGNOSIS — Z87891 Personal history of nicotine dependence: Secondary | ICD-10-CM | POA: Insufficient documentation

## 2024-03-06 DIAGNOSIS — G9389 Other specified disorders of brain: Secondary | ICD-10-CM | POA: Diagnosis not present

## 2024-03-06 DIAGNOSIS — I6529 Occlusion and stenosis of unspecified carotid artery: Secondary | ICD-10-CM | POA: Diagnosis not present

## 2024-03-06 DIAGNOSIS — R42 Dizziness and giddiness: Principal | ICD-10-CM

## 2024-03-06 DIAGNOSIS — Q039 Congenital hydrocephalus, unspecified: Secondary | ICD-10-CM | POA: Insufficient documentation

## 2024-03-06 DIAGNOSIS — I672 Cerebral atherosclerosis: Secondary | ICD-10-CM | POA: Diagnosis not present

## 2024-03-06 DIAGNOSIS — Z950 Presence of cardiac pacemaker: Secondary | ICD-10-CM | POA: Diagnosis not present

## 2024-03-06 DIAGNOSIS — I129 Hypertensive chronic kidney disease with stage 1 through stage 4 chronic kidney disease, or unspecified chronic kidney disease: Secondary | ICD-10-CM | POA: Diagnosis not present

## 2024-03-06 DIAGNOSIS — N1832 Chronic kidney disease, stage 3b: Secondary | ICD-10-CM | POA: Diagnosis not present

## 2024-03-06 DIAGNOSIS — G459 Transient cerebral ischemic attack, unspecified: Secondary | ICD-10-CM | POA: Diagnosis not present

## 2024-03-06 HISTORY — DX: Calculus of kidney: N20.0

## 2024-03-06 LAB — URINE DRUG SCREEN
Amphetamines: NOT DETECTED
Barbiturates: NOT DETECTED
Benzodiazepines: NOT DETECTED
Cocaine: NOT DETECTED
Fentanyl: NOT DETECTED
Methadone Scn, Ur: NOT DETECTED
Opiates: NOT DETECTED
Tetrahydrocannabinol: NOT DETECTED

## 2024-03-06 LAB — CBC
HCT: 42.5 % (ref 39.0–52.0)
Hemoglobin: 14.7 g/dL (ref 13.0–17.0)
MCH: 30.2 pg (ref 26.0–34.0)
MCHC: 34.6 g/dL (ref 30.0–36.0)
MCV: 87.3 fL (ref 80.0–100.0)
Platelets: 161 10*3/uL (ref 150–400)
RBC: 4.87 MIL/uL (ref 4.22–5.81)
RDW: 12.9 % (ref 11.5–15.5)
WBC: 5.3 10*3/uL (ref 4.0–10.5)
nRBC: 0 % (ref 0.0–0.2)

## 2024-03-06 LAB — DIFFERENTIAL
Abs Immature Granulocytes: 0.01 10*3/uL (ref 0.00–0.07)
Basophils Absolute: 0 10*3/uL (ref 0.0–0.1)
Basophils Relative: 1 %
Eosinophils Absolute: 0.1 10*3/uL (ref 0.0–0.5)
Eosinophils Relative: 1 %
Immature Granulocytes: 0 %
Lymphocytes Relative: 31 %
Lymphs Abs: 1.6 10*3/uL (ref 0.7–4.0)
Monocytes Absolute: 0.4 10*3/uL (ref 0.1–1.0)
Monocytes Relative: 8 %
Neutro Abs: 3.2 10*3/uL (ref 1.7–7.7)
Neutrophils Relative %: 59 %

## 2024-03-06 LAB — COMPREHENSIVE METABOLIC PANEL WITH GFR
ALT: 21 U/L (ref 0–44)
AST: 25 U/L (ref 15–41)
Albumin: 4.6 g/dL (ref 3.5–5.0)
Alkaline Phosphatase: 46 U/L (ref 38–126)
Anion gap: 7 (ref 5–15)
BUN: 16 mg/dL (ref 8–23)
CO2: 27 mmol/L (ref 22–32)
Calcium: 10.2 mg/dL (ref 8.9–10.3)
Chloride: 104 mmol/L (ref 98–111)
Creatinine, Ser: 1.46 mg/dL — ABNORMAL HIGH (ref 0.61–1.24)
GFR, Estimated: 50 mL/min — ABNORMAL LOW (ref >60)
Glucose, Bld: 98 mg/dL (ref 70–99)
Potassium: 4.2 mmol/L (ref 3.5–5.1)
Sodium: 138 mmol/L (ref 135–145)
Total Bilirubin: 0.7 mg/dL (ref 0.0–1.2)
Total Protein: 7 g/dL (ref 6.5–8.1)

## 2024-03-06 LAB — ETHANOL: Alcohol, Ethyl (B): <15 mg/dL (ref <15)

## 2024-03-06 LAB — APTT: aPTT: 31 s (ref 24–36)

## 2024-03-06 LAB — PROTIME-INR
INR: 0.9 (ref 0.8–1.2)
Prothrombin Time: 12.4 s (ref 11.4–15.2)

## 2024-03-06 MED ORDER — IOHEXOL 350 MG/ML SOLN
100.0000 mL | Freq: Once | INTRAVENOUS | Status: AC | PRN
  Administered 2024-03-06: 75 mL via INTRAVENOUS

## 2024-03-06 NOTE — ED Provider Notes (Signed)
 Care of patient received from prior provider at 3:08 PM, please see their note for complete H/P and care plan.  Received handoff per ED course.  Clinical Course as of 03/06/24 2310  Thu Mar 06, 2024  1507 Stable 0400 wake up CVA sxs. Transfer to [facility] for CVA eval. [CC]  1551 Admitted for CVA workup tomorrow AM [CC]    Clinical Course User Index [CC] Onetha Bile, MD     Onetha Bile, MD 03/06/24 2310

## 2024-03-06 NOTE — ED Provider Notes (Addendum)
 [place] EMERGENCY DEPARTMENT AT [facility] Provider Note   CSN: [id number] Arrival date & time: 03/06/24  0825     History  Chief Complaint  Patient presents with   Leonard Maldonado    HA PLACERES is a 73 y.o. male.  Patient woke up with significant Leonard Maldonado room spinning at 4 in the morning.  Checked his blood pressure and blood pressure was 169 systolic.  He was worried about the blood pressure as the cause so came in from [address] of here.  Patient to maybe about 30 years ago had a brief period of some room spinning vertigo.  Patient even upon walking in here had to go slow with it.  Things have improved but have not resolved.  Denies any numbness or weakness in his extremities no slurred speech no visual changes.  No facial droop.  No headache.  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  Patient is a former smoker quit in 1973.       Home Medications Prior to Admission medications   Medication Sig Start Date End Date Taking? Authorizing Provider  acetaminophen  (TYLENOL ) 325 MG tablet Take 2 tablets (650 mg total) by mouth every 4 (four) hours as needed for headache or mild pain. 09/17/19   Tylene Galla, PA-C  aspirin 81 MG tablet Take 81 mg by mouth 2 (two) times a week.     [provider]  calcium carbonate (OS-CAL) 600 MG tablet daily at 6 (six) AM.    [provider]  cefadroxil  (DURICEF) 500 MG capsule Take 1 capsule (500 mg total) by mouth 2 (two) times daily. 10/08/23   [place] Butter, PA  cholecalciferol (VITAMIN D) 1000 UNITS tablet Take 1,000 Units by mouth daily.    [provider]  COENZYME Q-10 PO Take 1 tablet by mouth daily.    [provider]  diclofenac sodium (VOLTAREN) 1 % GEL Apply 2 g topically 4 (four) times daily.    [provider]  finasteride  (PROSCAR ) 5 MG tablet Take 5 mg by mouth every evening.   08/05/18   [provider]  latanoprost  (XALATAN ) 0.005 % ophthalmic solution Place 1 drop into both eyes at bedtime. 10/15/18   [provider]  Magnesium 250 MG TABS Take 250 mg by mouth 2 (two) times a week.    [provider]  Multiple Vitamin (MULTIVITAMIN) tablet Take 1 tablet by mouth daily.    [provider]  Multiple Vitamins-Minerals (EMERGEN-C VITAMIN C) PACK Take 1 Dose by mouth 2 (two) times a week.    [provider]  mupirocin  ointment (BACTROBAN ) 2 % Apply 1 Application topically 2 (two) times daily. 10/27/23   [place] Butter, PA  Omega-3 Fatty Acids (FISH OIL) 1200 MG CAPS Take 1,200 mg by mouth daily.     [provider]  oxybutynin  (DITROPAN -XL) 5 MG 24 hr tablet Take 5 mg by mouth daily. 09/25/22   [provider]  pantoprazole  (PROTONIX ) 40 MG tablet Take 40 mg by mouth daily. 08/21/22   [provider]  Probiotic Product (PROBIOTIC DAILY PO) Take 1 capsule by mouth 2 (two) times a week.     [provider]  RSV vaccine recomb adjuvanted (AREXVY ) 120 MCG/0.5ML injection Inject into the muscle. 09/05/22     sildenafil (REVATIO) 20 MG tablet Take 20-40 mg by mouth as needed (for ED).  08/30/19   [provider]  simvastatin  (ZOCOR ) 20 MG tablet Take 20 mg by mouth at bedtime.     [provider]  tamsulosin  (FLOMAX ) 0.4 MG CAPS capsule Take 0.4 mg by mouth 2 (two) times daily.  08/27/18   [provider]      Allergies    Patient has no known allergies.    Review of Systems   Review of Systems  Constitutional:  Negative for chills and fever.  HENT:  Negative for ear pain and sore throat.   Eyes:  Negative for pain and visual disturbance.  Respiratory:  Negative for cough and shortness of breath.   Cardiovascular:  Negative for chest pain and palpitations.  Gastrointestinal:  Negative for abdominal pain and vomiting.  Genitourinary:  Negative for dysuria and  hematuria.  Musculoskeletal:  Negative for arthralgias and back pain.  Skin:  Negative for color change and rash.  Neurological:  Positive for Leonard Maldonado. Negative for seizures, syncope, speech difficulty, weakness, numbness and headaches.  All other systems reviewed and are negative.   Physical Exam Updated Vital Signs BP (!) 153/88 (BP Location: Right Arm)   Pulse (!) 56   Temp 97.7 F (36.5 C) (Oral)   Resp 14   Ht 1.88 m (6\' 2" )   Wt 95.3 kg   SpO2 99%   BMI 26.96 kg/m  Physical Exam Vitals and nursing note reviewed.  Constitutional:      General: He is not in acute distress.    Appearance: Normal appearance. He is well-developed.  HENT:     Head: Normocephalic and atraumatic.  Eyes:     Extraocular Movements: Extraocular movements intact.     Conjunctiva/sclera: Conjunctivae normal.     Pupils: Pupils are equal, round, and reactive to light.  Cardiovascular:     Rate and Rhythm: Normal rate and regular rhythm.     Heart sounds: No murmur heard. Pulmonary:     Effort: Pulmonary effort is normal. No respiratory distress.     Breath sounds: Normal breath sounds.  Abdominal:     Palpations: Abdomen is soft.     Tenderness: There is no abdominal tenderness.  Musculoskeletal:        General: No swelling.     Cervical back: Normal range of motion and neck supple.  Skin:    General: Skin is warm and dry.     Capillary Refill: Capillary refill takes less than 2 seconds.  Neurological:     General: No focal deficit present.     Mental Status: He is alert and oriented to person, place, and time.     Cranial Nerves: No cranial nerve deficit.     Sensory: No sensory deficit.     Motor: No weakness.     Coordination: Coordination normal.  Psychiatric:        Mood and Affect: Mood normal.     ED Results / Procedures / Treatments   Labs (all labs ordered are listed, but only abnormal results are displayed) Labs Reviewed  ETHANOL  PROTIME-INR  APTT  CBC   DIFFERENTIAL  COMPREHENSIVE METABOLIC PANEL WITH GFR  URINE DRUG SCREEN    EKG EKG Interpretation Date/Time:  Thursday March 06 2024 08:38:51 EDT Ventricular Rate:  56 PR Interval:  217 QRS Duration:  156 QT Interval:  480 QTC Calculation: 464 R Axis:   -82  Text Interpretation: Sinus rhythm Borderline prolonged PR interval Nonspecific IVCD with LAD Left ventricular hypertrophy Probable inferior infarct, acute Probable anterolateral infarct, recent Atrial sensed ventricular paced No  significant change since last tracing Confirmed by Kammie Scioli [phone]) on 03/06/2024 9:37:08 AM  Radiology No results found.  Procedures Procedures    Medications Ordered in ED Medications - No data to display  ED Course/ Medical Decision Making/ A&P                                 Medical Decision Making Amount and/or Complexity of Data Reviewed Labs: ordered. Radiology: ordered.  Risk Prescription drug management.   Patient's EKG consistent with an atrial sensed ventricular paced rhythm due to his pacemaker.  Need to inquire with the patient when he had the pacemaker placed.  Because he may be prohibited from getting MRI.  Blood pressure here somewhat improved at 153/88.  But we will see what head CT shows see with the stroke labs show.  CRITICAL CARE Performed by: Kema Santaella Total critical care time: 45 minutes Critical care time was exclusive of separately billable procedures and treating other patients. Critical care was necessary to treat or prevent imminent or life-threatening deterioration. Critical care was time spent personally by me on the following activities: development of treatment plan with patient and/or surrogate as well as nursing, discussions with consultants, evaluation of patient's response to treatment, examination of patient, obtaining history from patient or surrogate, ordering and performing treatments and interventions, ordering and review of  laboratory studies, ordering and review of radiographic studies, pulse oximetry and re-evaluation of patient's condition.  In addition not able to reproduce the vertigo with head movement.   Patient does have his [employer] card.  It says MR conditional.  Not exactly sure what that means but may end up talking to the neurologist to get that sorted out.  Discussed with on-call neurology Dr. Alecia Ames, he recommended CT angio head and neck and if negative then transferring to [facility] for MRI brain.  Did clarify that his [employer] pacemaker is compatible with MRI.  The concern is for possible stroke as the cause of the acute onset of the Leonard Maldonado.  Final Clinical Impression(s) / ED Diagnoses Final diagnoses:  Leonard Maldonado    Rx / DC Orders ED Discharge Orders     None         Nicklas Barns, MD 03/06/24 1000    Nicklas Barns, MD 03/06/24 1005    Nicklas Barns, MD 03/06/24 1451

## 2024-03-06 NOTE — ED Triage Notes (Signed)
 In for eval of dizziness. He woke up with the dizziness at 0400, checked his BP 169 systolic. Denies pain, headache, blurred vision, and nosebleed. No extremity weakness, slurred speech, or facial droop noted.

## 2024-03-06 NOTE — H&P (Signed)
 History and Physical    Leonard Maldonado WUJ:[id number] DOB: 08-03-1951 DOA: 03/06/2024  PCP: Jearldine Mina, MD  Patient coming from:  [facility]  I have personally briefly reviewed patient's old medical records in [facility]  Chief Complaint: dizziness  HPI: Leonard Maldonado is a 73 y.o. male with medical history significant of  HTN, HLD,CKDIIIb, GERD Second degree AV block s/p pacemaker ,left lower extremity chronic ulcer followed by wound care. Patient presents to [facility] ED With complaint of dizziness on waking this am at around 0400.  He described symptoms as room spinning. He got concerned and checked his vitals and noted his sbp was 169.Due to this he presented to ED. Patient currently note no dizziness. He states he has been up to bathroom without difficulty. He states he feels he is at his baseline. He denies any focal weakness , HA, n/v/d/ vision changes or speech difficulties.  ED Course: Vitals: afeb, bp 153/88, hr 56, rr 14, sat 99%   Nsr paced  Labs wbc 5.3, hgb 14.7, plt161 UDS neg ETOH <15 Na 138, 4.2, CL 104, bicarb 27, glu98, cr 1.46 CTH: NAD CTA: no LVO Patient case was discussed with neurology who recommended [facility] and completion of cva work up   Review of Systems: As per HPI otherwise 10 point review of systems negative.   Past Medical History:  Diagnosis Date   HTN (hypertension)    Hyperlipemia    Kidney calculus    Second degree AV block     Past Surgical History:  Procedure Laterality Date   COLONOSCOPY WITH PROPOFOL  N/A 11/04/2015   Procedure: COLONOSCOPY WITH PROPOFOL ;  Surgeon: Garrett Kallman, MD;  Location: [facility] ENDOSCOPY;  Service: Endoscopy;  Laterality: N/A;   EXTRACORPOREAL SHOCK WAVE LITHOTRIPSY Left 10/10/2021   Procedure: EXTRACORPOREAL SHOCK WAVE LITHOTRIPSY (ESWL);  Surgeon: Samson Croak, MD;  Location: [facility];  Service: Urology;  Laterality: Left;   PACEMAKER LEADLESS INSERTION N/A 09/16/2019    Procedure: PACEMAKER LEADLESS INSERTION;  Surgeon: Jolly Needle, MD;  Location: [facility] INVASIVE CV LAB;  Service: Cardiovascular;  Laterality: N/A;   SHOULDER SURGERY       reports that he quit smoking about 52 years ago. His smoking use included cigarettes. He started smoking about 62 years ago. He has a 10 pack-year smoking history. He has never used smokeless tobacco. He reports current alcohol use. He reports that he does not use drugs.  No Known Allergies  Family History  Problem Relation Age of Onset   Emphysema Father    Lung cancer Father     Prior to Admission medications   Medication Sig Start Date End Date Taking? Authorizing Provider  acetaminophen  (TYLENOL ) 325 MG tablet Take 2 tablets (650 mg total) by mouth every 4 (four) hours as needed for headache or mild pain. 09/17/19   Tylene Galla, PA-C  aspirin 81 MG tablet Take 81 mg by mouth 2 (two) times a week.     [provider]  calcium carbonate (OS-CAL) 600 MG tablet daily at 6 (six) AM.    [provider]  cefadroxil  (DURICEF) 500 MG capsule Take 1 capsule (500 mg total) by mouth 2 (two) times daily. 10/08/23   [place] Butter, PA  cholecalciferol (VITAMIN D) 1000 UNITS tablet Take 1,000 Units by mouth daily.    [provider]  COENZYME Q-10 PO Take 1 tablet by mouth daily.    [provider]  diclofenac  sodium (VOLTAREN) 1 % GEL Apply 2 g topically 4 (four) times daily.    [provider]  finasteride  (PROSCAR ) 5 MG tablet Take 5 mg by mouth every evening.  08/05/18   [provider]  latanoprost  (XALATAN ) 0.005 % ophthalmic solution Place 1 drop into both eyes at bedtime. 10/15/18   [provider]  Magnesium 250 MG TABS Take 250 mg by mouth 2 (two) times a week.    [provider]  Multiple Vitamin (MULTIVITAMIN) tablet Take 1 tablet by mouth daily.    [provider]  Multiple Vitamins-Minerals (EMERGEN-C VITAMIN C) PACK Take 1 Dose  by mouth 2 (two) times a week.    [provider]  mupirocin  ointment (BACTROBAN ) 2 % Apply 1 Application topically 2 (two) times daily. 10/27/23   [place] Butter, PA  Omega-3 Fatty Acids (FISH OIL) 1200 MG CAPS Take 1,200 mg by mouth daily.     [provider]  oxybutynin  (DITROPAN -XL) 5 MG 24 hr tablet Take 5 mg by mouth daily. 09/25/22   [provider]  pantoprazole  (PROTONIX ) 40 MG tablet Take 40 mg by mouth daily. 08/21/22   [provider]  Probiotic Product (PROBIOTIC DAILY PO) Take 1 capsule by mouth 2 (two) times a week.     [provider]  RSV vaccine recomb adjuvanted (AREXVY ) 120 MCG/0.5ML injection Inject into the muscle. 09/05/22     sildenafil (REVATIO) 20 MG tablet Take 20-40 mg by mouth as needed (for ED).  08/30/19   [provider]  simvastatin  (ZOCOR ) 20 MG tablet Take 20 mg by mouth at bedtime.     [provider]  tamsulosin  (FLOMAX ) 0.4 MG CAPS capsule Take 0.4 mg by mouth 2 (two) times daily.  08/27/18   [provider]    Physical Exam: Vitals:   03/06/24 1900 03/06/24 1930 03/06/24 2000 03/06/24 2118  BP: (!) 149/81 137/73 135/79 (!) 157/85  Pulse: 60 63 62 62  Resp: 20 16 17 18   Temp:    (!) 97.5 F (36.4 C)  TempSrc:    Oral  SpO2: 97% 97% 95%   Weight:      Height:        Constitutional: NAD, calm, comfortable Vitals:   03/06/24 1900 03/06/24 1930 03/06/24 2000 03/06/24 2118  BP: (!) 149/81 137/73 135/79 (!) 157/85  Pulse: 60 63 62 62  Resp: 20 16 17 18   Temp:    (!) 97.5 F (36.4 C)  TempSrc:    Oral  SpO2: 97% 97% 95%   Weight:      Height:       Eyes: PERRL, lids and conjunctivae normal ENMT: Mucous membranes are moist. Posterior pharynx clear of any exudate or lesions.Normal dentition.  Neck: normal, supple, no masses, no thyromegaly Respiratory: clear to auscultation bilaterally, no wheezing, no crackles. Normal respiratory effort. No accessory muscle use.   Cardiovascular: Regular rate and rhythm, no murmurs / rubs / gallops. No extremity edema. 2+ pedal pulses. Abdomen: no tenderness, no masses palpated. No hepatosplenomegaly. Bowel sounds positive.  Musculoskeletal: no clubbing / cyanosis. No joint deformity upper and lower extremities. Good ROM, no contractures. Normal muscle tone.  Skin: no rashes, lesions, ulcers. No induration Neurologic: CN 2-12 grossly intact. Sensation intact  Strength 5/5 in all 4.  Psychiatric: Normal judgment and insight. Alert and oriented x 3. Normal mood.    Labs on Admission: I have personally reviewed following labs and imaging studies  CBC: Recent Labs  Lab 03/06/24 1015  WBC 5.3  NEUTROABS 3.2  HGB 14.7  HCT 42.5  MCV 87.3  PLT 161   Basic Metabolic Panel: Recent Labs  Lab 03/06/24 1015  NA 138  K 4.2  CL 104  CO2 27  GLUCOSE 98  BUN 16  CREATININE 1.46*  CALCIUM 10.2   GFR: Estimated Creatinine Clearance: 51.6 mL/min (A) (by C-G formula based on SCr of 1.46 mg/dL (H)). Liver Function Tests: Recent Labs  Lab 03/06/24 1015  AST 25  ALT 21  ALKPHOS 46  BILITOT 0.7  PROT 7.0  ALBUMIN 4.6   No results for input(s): "LIPASE", "AMYLASE" in the last 168 hours. No results for input(s): "AMMONIA" in the last 168 hours. Coagulation Profile: Recent Labs  Lab 03/06/24 1015  INR 0.9   Cardiac Enzymes: No results for input(s): "CKTOTAL", "CKMB", "CKMBINDEX", "TROPONINI" in the last 168 hours. BNP (last 3 results) No results for input(s): "PROBNP" in the last 8760 hours. HbA1C: No results for input(s): "HGBA1C" in the last 72 hours. CBG: No results for input(s): "GLUCAP" in the last 168 hours. Lipid Profile: No results for input(s): "CHOL", "HDL", "LDLCALC", "TRIG", "CHOLHDL", "LDLDIRECT" in the last 72 hours. Thyroid Function Tests: No results for input(s): "TSH", "T4TOTAL", "FREET4", "T3FREE", "THYROIDAB" in the last 72 hours. Anemia Panel: No results for input(s):  "VITAMINB12", "FOLATE", "FERRITIN", "TIBC", "IRON", "RETICCTPCT" in the last 72 hours. Urine analysis: No results found for: "COLORURINE", "APPEARANCEUR", "LABSPEC", "PHURINE", "GLUCOSEU", "HGBUR", "BILIRUBINUR", "KETONESUR", "PROTEINUR", "UROBILINOGEN", "NITRITE", "LEUKOCYTESUR"  Radiological Exams on Admission: CT ANGIO HEAD NECK W WO CM Result Date: 03/06/2024 CLINICAL DATA:  Neuro deficit, concern for stroke, evaluation of dizziness starting this morning. EXAM: CT ANGIOGRAPHY HEAD AND NECK WITH AND WITHOUT CONTRAST TECHNIQUE: Multidetector CT imaging of the head and neck was performed using the standard protocol during bolus administration of intravenous contrast. Multiplanar CT image reconstructions and MIPs were obtained to evaluate the vascular anatomy. Carotid stenosis measurements (when applicable) are obtained utilizing NASCET criteria, using the distal internal carotid diameter as the denominator. RADIATION DOSE REDUCTION: This exam was performed according to the departmental dose-optimization program which includes automated exposure control, adjustment of the mA and/or kV according to patient size and/or use of iterative reconstruction technique. CONTRAST:  75mL OMNIPAQUE  IOHEXOL  350 MG/ML SOLN COMPARISON:  Same-day head CT. FINDINGS: CTA NECK FINDINGS Aortic arch: Standard configuration of the aortic arch. Imaged portion shows no evidence of aneurysm or dissection. Mild atherosclerosis of the aortic arch. No significant stenosis of the major arch vessel origins. Pulmonary arteries: As permitted by contrast timing, there are no filling defects in the visualized pulmonary arteries. Subclavian arteries: The subclavian arteries are patent bilaterally. Right carotid system: No evidence of dissection, stenosis (50% or greater), or occlusion. Minimal atherosclerosis at the carotid bifurcation and along the proximal cervical ICA without stenosis. Left carotid system: No evidence of dissection, stenosis  (50% or greater), or occlusion. Vertebral arteries: Codominant. No evidence of dissection, stenosis (50% or greater), or occlusion. Mild tortuosity of the left V1 segment. Skeleton: No acute findings. Degenerative changes in the cervical spine. Intervertebral disc space narrowing is most pronounced at C5-6 and C6-7. Chronic appearing deformity of the C3 inferior endplate. Other neck: The visualized airway is patent. No cervical lymphadenopathy. Upper chest: Visualized lung apices are clear. Review of the MIP images confirms the above findings CTA HEAD FINDINGS ANTERIOR CIRCULATION: The intracranial ICAs are patent bilaterally. No significant stenosis, proximal occlusion, aneurysm, or vascular malformation. MCAs: The middle cerebral arteries  are patent bilaterally. ACAs: The anterior cerebral arteries are patent bilaterally. POSTERIOR CIRCULATION: No significant stenosis, proximal occlusion, aneurysm, or vascular malformation. PCAs: The posterior cerebral arteries are patent bilaterally. Pcomm: The posterior communicating arteries are visualized bilaterally. SCAs: The superior cerebellar arteries are patent bilaterally. Basilar artery: Patent AICAs: Not well visualized. PICAs: Patent Vertebral arteries: The intracranial vertebral arteries are patent. Venous sinuses: As permitted by contrast timing, patent. Anatomic variants: None Review of the MIP images confirms the above findings IMPRESSION: No large vessel occlusion. No high-grade stenosis, aneurysm, or vascular malformation of the arteries in the head and neck. Aortic Atherosclerosis (ICD10-I70.0). Electronically Signed   By: Denny Flack M.D.   On: 03/06/2024 14:37   CT HEAD WO CONTRAST Result Date: 03/06/2024 CLINICAL DATA:  Stroke suspected, woke up with dizziness and hypertension EXAM: CT HEAD WITHOUT CONTRAST TECHNIQUE: Contiguous axial images were obtained from the base of the skull through the vertex without intravenous contrast. RADIATION DOSE  REDUCTION: This exam was performed according to the departmental dose-optimization program which includes automated exposure control, adjustment of the mA and/or kV according to patient size and/or use of iterative reconstruction technique. COMPARISON:  None Available. FINDINGS: Brain: No evidence of acute infarction, hemorrhage, hydrocephalus, extra-axial collection or mass lesion/mass effect. Ventriculomegaly with some disproportionate subarachnoid spaces but possibly congruous with a degree of atrophy. No chronic ischemic injury detected. Vascular: No hyperdense vessel or unexpected calcification. Skull: Normal. Negative for fracture or focal lesion. Sinuses/Orbits: No acute finding. IMPRESSION: No acute finding. Electronically Signed   By: Ronnette Coke M.D.   On: 03/06/2024 10:47    EKG: Independently reviewed.   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  -continue to monitor labs   HTN -currently stable  - continue on permissive hypertension 24-48 hours or until MRI negative    HLD -continue statin   GERD -PPI  Second degree AV block  -s/p pacemaker , -will need to request interrogation in am   Left lower extremity chronic ulcer  -followed by wound care out patient  - will place in patient consult   DVT prophylaxis:heparin  sq Code Status: full/ as discussed per patient wishes in event of cardiac arrest  Family Communication: none at bedside Disposition Plan: patient  expected to be admitted greater than 2 midnights  Consults called: Neuro Dr Alecia Ames Admission status: progressive    Sabas Cradle MD [facility]   If 7PM-7AM, please contact night-coverage [email or web address] Password [facility]  03/06/2024, 11:47 PM

## 2024-03-06 NOTE — H&P (Incomplete)
 History and Physical    Leonard Maldonado:[id number] DOB: 1951-03-24 DOA: 03/06/2024  PCP: Jearldine Mina, MD  Patient coming from:  [facility]  I have personally briefly reviewed patient's old medical records in [facility]  Chief Complaint: dizziness  HPI: Leonard Maldonado is a 73 y.o. male with medical history significant of  HTN, HLD,GERD Second degree AV block s/p pacemaker ,left lower extremity chronic ulcer followed by wound care. Patient presents to [facility] ED With complaint of dizziness on waking this am at around 0400.  He decreased symptoms as room spinning. He got concerned and checked his vitals and noted his sbp was 169.  ED Course: Vitals: afeb, bp 153/88, hr 56, rr 14, sat 99%   Nsr paced  Labs wbc 5.3, hgb 14.7, plt161 UDS neg ETOH <15 Na 138, 4.2, CL 104, bicarb 27, glu98, cr 1.46 CTH: NAD CTA: no LVO  Review of Systems: As per HPI otherwise 10 point review of systems negative.   Past Medical History:  Diagnosis Date  . HTN (hypertension)   . Hyperlipemia   . Kidney calculus   . Second degree AV block     Past Surgical History:  Procedure Laterality Date  . COLONOSCOPY WITH PROPOFOL  N/A 11/04/2015   Procedure: COLONOSCOPY WITH PROPOFOL ;  Surgeon: Garrett Kallman, MD;  Location: [facility];  Service: Endoscopy;  Laterality: N/A;  . EXTRACORPOREAL SHOCK WAVE LITHOTRIPSY Left 10/10/2021   Procedure: EXTRACORPOREAL SHOCK WAVE LITHOTRIPSY (ESWL);  Surgeon: Samson Croak, MD;  Location: [facility];  Service: Urology;  Laterality: Left;  . PACEMAKER LEADLESS INSERTION N/A 09/16/2019   Procedure: PACEMAKER LEADLESS INSERTION;  Surgeon: Jolly Needle, MD;  Location: [facility] INVASIVE CV LAB;  Service: Cardiovascular;  Laterality: N/A;  . SHOULDER SURGERY       reports that he quit smoking about 52 years ago. His smoking use included cigarettes. He started smoking about 62 years ago. He has a 10 pack-year smoking history. He has never used smokeless  tobacco. He reports current alcohol use. He reports that he does not use drugs.  No Known Allergies  Family History  Problem Relation Age of Onset  . Emphysema Father   . Lung cancer Father    *** Prior to Admission medications   Medication Sig Start Date End Date Taking? Authorizing Provider  acetaminophen  (TYLENOL ) 325 MG tablet Take 2 tablets (650 mg total) by mouth every 4 (four) hours as needed for headache or mild pain. 09/17/19   Tylene Galla, PA-C  aspirin 81 MG tablet Take 81 mg by mouth 2 (two) times a week.     [provider]  calcium carbonate (OS-CAL) 600 MG tablet daily at 6 (six) AM.    [provider]  cefadroxil  (DURICEF) 500 MG capsule Take 1 capsule (500 mg total) by mouth 2 (two) times daily. 10/08/23   [place] Butter, PA  cholecalciferol (VITAMIN D) 1000 UNITS tablet Take 1,000 Units by mouth daily.    [provider]  COENZYME Q-10 PO Take 1 tablet by mouth daily.    [provider]  diclofenac sodium (VOLTAREN) 1 % GEL Apply 2 g topically 4 (four) times daily.    [provider]  finasteride  (PROSCAR ) 5 MG tablet Take 5 mg by mouth every evening.  08/05/18   [provider]  latanoprost  (XALATAN ) 0.005 % ophthalmic solution Place 1 drop into both eyes at bedtime. 10/15/18   [provider]  Magnesium 250 MG  TABS Take 250 mg by mouth 2 (two) times a week.    [provider]  Multiple Vitamin (MULTIVITAMIN) tablet Take 1 tablet by mouth daily.    [provider]  Multiple Vitamins-Minerals (EMERGEN-C VITAMIN C) PACK Take 1 Dose by mouth 2 (two) times a week.    [provider]  mupirocin  ointment (BACTROBAN ) 2 % Apply 1 Application topically 2 (two) times daily. 10/27/23   [place] Butter, PA  Omega-3 Fatty Acids (FISH OIL) 1200 MG CAPS Take 1,200 mg by mouth daily.     [provider]  oxybutynin  (DITROPAN -XL) 5 MG 24 hr tablet Take 5 mg by mouth daily.  09/25/22   [provider]  pantoprazole  (PROTONIX ) 40 MG tablet Take 40 mg by mouth daily. 08/21/22   [provider]  Probiotic Product (PROBIOTIC DAILY PO) Take 1 capsule by mouth 2 (two) times a week.     [provider]  RSV vaccine recomb adjuvanted (AREXVY ) 120 MCG/0.5ML injection Inject into the muscle. 09/05/22     sildenafil (REVATIO) 20 MG tablet Take 20-40 mg by mouth as needed (for ED).  08/30/19   [provider]  simvastatin  (ZOCOR ) 20 MG tablet Take 20 mg by mouth at bedtime.     [provider]  tamsulosin  (FLOMAX ) 0.4 MG CAPS capsule Take 0.4 mg by mouth 2 (two) times daily.  08/27/18   [provider]    Physical Exam: Vitals:   03/06/24 1900 03/06/24 1930 03/06/24 2000 03/06/24 2118  BP: (!) 149/81 137/73 135/79 (!) 157/85  Pulse: 60 63 62 62  Resp: 20 16 17 18   Temp:    (!) 97.5 F (36.4 C)  TempSrc:    Oral  SpO2: 97% 97% 95%   Weight:      Height:        Constitutional: NAD, calm, comfortable Vitals:   03/06/24 1900 03/06/24 1930 03/06/24 2000 03/06/24 2118  BP: (!) 149/81 137/73 135/79 (!) 157/85  Pulse: 60 63 62 62  Resp: 20 16 17 18   Temp:    (!) 97.5 F (36.4 C)  TempSrc:    Oral  SpO2: 97% 97% 95%   Weight:      Height:       Eyes: PERRL, lids and conjunctivae normal ENMT: Mucous membranes are moist. Posterior pharynx clear of any exudate or lesions.Normal dentition.  Neck: normal, supple, no masses, no thyromegaly Respiratory: clear to auscultation bilaterally, no wheezing, no crackles. Normal respiratory effort. No accessory muscle use.  Cardiovascular: Regular rate and rhythm, no murmurs / rubs / gallops. No extremity edema. 2+ pedal pulses. No carotid bruits.  Abdomen: no tenderness, no masses palpated. No hepatosplenomegaly. Bowel sounds positive.  Musculoskeletal: no clubbing / cyanosis. No joint deformity upper and lower extremities. Good ROM, no contractures. Normal muscle tone.  Skin:  no rashes, lesions, ulcers. No induration Neurologic: CN 2-12 grossly intact. Sensation intact, DTR normal. Strength 5/5 in all 4.  Psychiatric: Normal judgment and insight. Alert and oriented x 3. Normal mood.    Labs on Admission: I have personally reviewed following labs and imaging studies  CBC: Recent Labs  Lab 03/06/24 1015  WBC 5.3  NEUTROABS 3.2  HGB 14.7  HCT 42.5  MCV 87.3  PLT 161   Basic Metabolic Panel: Recent Labs  Lab 03/06/24 1015  NA 138  K 4.2  CL 104  CO2 27  GLUCOSE 98  BUN 16  CREATININE 1.46*  CALCIUM 10.2   GFR:  Estimated Creatinine Clearance: 51.6 mL/min (A) (by C-G formula based on SCr of 1.46 mg/dL (H)). Liver Function Tests: Recent Labs  Lab 03/06/24 1015  AST 25  ALT 21  ALKPHOS 46  BILITOT 0.7  PROT 7.0  ALBUMIN 4.6   No results for input(s): "LIPASE", "AMYLASE" in the last 168 hours. No results for input(s): "AMMONIA" in the last 168 hours. Coagulation Profile: Recent Labs  Lab 03/06/24 1015  INR 0.9   Cardiac Enzymes: No results for input(s): "CKTOTAL", "CKMB", "CKMBINDEX", "TROPONINI" in the last 168 hours. BNP (last 3 results) No results for input(s): "PROBNP" in the last 8760 hours. HbA1C: No results for input(s): "HGBA1C" in the last 72 hours. CBG: No results for input(s): "GLUCAP" in the last 168 hours. Lipid Profile: No results for input(s): "CHOL", "HDL", "LDLCALC", "TRIG", "CHOLHDL", "LDLDIRECT" in the last 72 hours. Thyroid Function Tests: No results for input(s): "TSH", "T4TOTAL", "FREET4", "T3FREE", "THYROIDAB" in the last 72 hours. Anemia Panel: No results for input(s): "VITAMINB12", "FOLATE", "FERRITIN", "TIBC", "IRON", "RETICCTPCT" in the last 72 hours. Urine analysis: No results found for: "COLORURINE", "APPEARANCEUR", "LABSPEC", "PHURINE", "GLUCOSEU", "HGBUR", "BILIRUBINUR", "KETONESUR", "PROTEINUR", "UROBILINOGEN", "NITRITE", "LEUKOCYTESUR"  Radiological Exams on Admission: CT ANGIO HEAD NECK W WO  CM Result Date: 03/06/2024 CLINICAL DATA:  Neuro deficit, concern for stroke, evaluation of dizziness starting this morning. EXAM: CT ANGIOGRAPHY HEAD AND NECK WITH AND WITHOUT CONTRAST TECHNIQUE: Multidetector CT imaging of the head and neck was performed using the standard protocol during bolus administration of intravenous contrast. Multiplanar CT image reconstructions and MIPs were obtained to evaluate the vascular anatomy. Carotid stenosis measurements (when applicable) are obtained utilizing NASCET criteria, using the distal internal carotid diameter as the denominator. RADIATION DOSE REDUCTION: This exam was performed according to the departmental dose-optimization program which includes automated exposure control, adjustment of the mA and/or kV according to patient size and/or use of iterative reconstruction technique. CONTRAST:  75mL OMNIPAQUE  IOHEXOL  350 MG/ML SOLN COMPARISON:  Same-day head CT. FINDINGS: CTA NECK FINDINGS Aortic arch: Standard configuration of the aortic arch. Imaged portion shows no evidence of aneurysm or dissection. Mild atherosclerosis of the aortic arch. No significant stenosis of the major arch vessel origins. Pulmonary arteries: As permitted by contrast timing, there are no filling defects in the visualized pulmonary arteries. Subclavian arteries: The subclavian arteries are patent bilaterally. Right carotid system: No evidence of dissection, stenosis (50% or greater), or occlusion. Minimal atherosclerosis at the carotid bifurcation and along the proximal cervical ICA without stenosis. Left carotid system: No evidence of dissection, stenosis (50% or greater), or occlusion. Vertebral arteries: Codominant. No evidence of dissection, stenosis (50% or greater), or occlusion. Mild tortuosity of the left V1 segment. Skeleton: No acute findings. Degenerative changes in the cervical spine. Intervertebral disc space narrowing is most pronounced at C5-6 and C6-7. Chronic appearing deformity  of the C3 inferior endplate. Other neck: The visualized airway is patent. No cervical lymphadenopathy. Upper chest: Visualized lung apices are clear. Review of the MIP images confirms the above findings CTA HEAD FINDINGS ANTERIOR CIRCULATION: The intracranial ICAs are patent bilaterally. No significant stenosis, proximal occlusion, aneurysm, or vascular malformation. MCAs: The middle cerebral arteries are patent bilaterally. ACAs: The anterior cerebral arteries are patent bilaterally. POSTERIOR CIRCULATION: No significant stenosis, proximal occlusion, aneurysm, or vascular malformation. PCAs: The posterior cerebral arteries are patent bilaterally. Pcomm: The posterior communicating arteries are visualized bilaterally. SCAs: The superior cerebellar arteries are patent bilaterally. Basilar artery: Patent AICAs: Not well visualized. PICAs: Patent Vertebral arteries: The intracranial  vertebral arteries are patent. Venous sinuses: As permitted by contrast timing, patent. Anatomic variants: None Review of the MIP images confirms the above findings IMPRESSION: No large vessel occlusion. No high-grade stenosis, aneurysm, or vascular malformation of the arteries in the head and neck. Aortic Atherosclerosis (ICD10-I70.0). Electronically Signed   By: Denny Flack M.D.   On: 03/06/2024 14:37   CT HEAD WO CONTRAST Result Date: 03/06/2024 CLINICAL DATA:  Stroke suspected, woke up with dizziness and hypertension EXAM: CT HEAD WITHOUT CONTRAST TECHNIQUE: Contiguous axial images were obtained from the base of the skull through the vertex without intravenous contrast. RADIATION DOSE REDUCTION: This exam was performed according to the departmental dose-optimization program which includes automated exposure control, adjustment of the mA and/or kV according to patient size and/or use of iterative reconstruction technique. COMPARISON:  None Available. FINDINGS: Brain: No evidence of acute infarction, hemorrhage, hydrocephalus,  extra-axial collection or mass lesion/mass effect. Ventriculomegaly with some disproportionate subarachnoid spaces but possibly congruous with a degree of atrophy. No chronic ischemic injury detected. Vascular: No hyperdense vessel or unexpected calcification. Skull: Normal. Negative for fracture or focal lesion. Sinuses/Orbits: No acute finding. IMPRESSION: No acute finding. Electronically Signed   By: Ronnette Coke M.D.   On: 03/06/2024 10:47    EKG: Independently reviewed. ***  Assessment/Plan Principal Problem:   TIA (transient ischemic attack)   ***  DVT prophylaxis: *** (Lovenox /Heparin /SCD's/anticoagulated/None (if comfort care) Code Status: *** (Full/Partial (specify details) Family Communication: *** (Specify name, relationship. Do not write "discussed with patient". Specify tel # if discussed over the phone) Disposition Plan: *** (specify when and where you expect patient to be discharged) Consults called: *** (with names) Admission status: *** (inpatient / obs / tele / medical floor / SDU)   Sabas Cradle MD [facility] Pager [phone]  If 7PM-7AM, please contact night-coverage [email or web address] Password [facility]  03/06/2024, 11:47 PM

## 2024-03-07 ENCOUNTER — Observation Stay (HOSPITAL_COMMUNITY)

## 2024-03-07 DIAGNOSIS — G459 Transient cerebral ischemic attack, unspecified: Secondary | ICD-10-CM

## 2024-03-07 DIAGNOSIS — G9389 Other specified disorders of brain: Secondary | ICD-10-CM | POA: Diagnosis not present

## 2024-03-07 DIAGNOSIS — I10 Essential (primary) hypertension: Secondary | ICD-10-CM

## 2024-03-07 DIAGNOSIS — R42 Dizziness and giddiness: Secondary | ICD-10-CM

## 2024-03-07 DIAGNOSIS — Z8673 Personal history of transient ischemic attack (TIA), and cerebral infarction without residual deficits: Secondary | ICD-10-CM | POA: Diagnosis not present

## 2024-03-07 DIAGNOSIS — G319 Degenerative disease of nervous system, unspecified: Secondary | ICD-10-CM | POA: Diagnosis not present

## 2024-03-07 LAB — CBC
HCT: 42.1 % (ref 39.0–52.0)
Hemoglobin: 14.5 g/dL (ref 13.0–17.0)
MCH: 30 pg (ref 26.0–34.0)
MCHC: 34.4 g/dL (ref 30.0–36.0)
MCV: 87.2 fL (ref 80.0–100.0)
Platelets: 164 10*3/uL (ref 150–400)
RBC: 4.83 MIL/uL (ref 4.22–5.81)
RDW: 12.7 % (ref 11.5–15.5)
WBC: 7.1 10*3/uL (ref 4.0–10.5)
nRBC: 0 % (ref 0.0–0.2)

## 2024-03-07 LAB — HEMOGLOBIN A1C
Hgb A1c MFr Bld: 4.8 % (ref 4.8–5.6)
Mean Plasma Glucose: 91.06 mg/dL

## 2024-03-07 LAB — ECHOCARDIOGRAM COMPLETE
AR max vel: 2.54 cm2
AV Area VTI: 2.75 cm2
AV Area mean vel: 2.23 cm2
AV Mean grad: 4 mmHg
AV Peak grad: 5.4 mmHg
Ao pk vel: 1.16 m/s
Area-P 1/2: 2.49 cm2
Calc EF: 64.2 %
Height: 74 in
MV VTI: 2.85 cm2
S' Lateral: 2.9 cm
Single Plane A2C EF: 68.5 %
Single Plane A4C EF: 60.2 %
Weight: 3360 [oz_av]

## 2024-03-07 LAB — CREATININE, SERUM
Creatinine, Ser: 1.45 mg/dL — ABNORMAL HIGH (ref 0.61–1.24)
GFR, Estimated: 51 mL/min — ABNORMAL LOW (ref >60)

## 2024-03-07 LAB — LIPID PANEL
Cholesterol: 137 mg/dL (ref 0–200)
HDL: 47 mg/dL (ref >40)
LDL Cholesterol: 76 mg/dL (ref 0–99)
Total CHOL/HDL Ratio: 2.9 ratio
Triglycerides: 72 mg/dL (ref <150)
VLDL: 14 mg/dL (ref 0–40)

## 2024-03-07 MED ORDER — OMEGA-3-ACID ETHYL ESTERS 1 G PO CAPS
1000.0000 mg | ORAL_CAPSULE | Freq: Every day | ORAL | Status: DC
  Filled 2024-03-07: qty 1

## 2024-03-07 MED ORDER — SODIUM CHLORIDE 0.9 % IV SOLN: INTRAVENOUS | Status: DC

## 2024-03-07 MED ORDER — ASPIRIN 81 MG PO TABS: 81.0000 mg | ORAL_TABLET | Freq: Every day | ORAL | 3 refills | Status: DC

## 2024-03-07 MED ORDER — SIMVASTATIN 40 MG PO TABS: 40.0000 mg | ORAL_TABLET | Freq: Every day | ORAL | 2 refills | Status: AC

## 2024-03-07 MED ORDER — ACETAMINOPHEN 325 MG PO TABS
650.0000 mg | ORAL_TABLET | ORAL | Status: DC | PRN
  Administered 2024-03-07: 650 mg via ORAL
  Filled 2024-03-07: qty 2

## 2024-03-07 MED ORDER — SIMVASTATIN 20 MG PO TABS
20.0000 mg | ORAL_TABLET | Freq: Every day | ORAL | Status: DC
  Administered 2024-03-07: 20 mg via ORAL
  Filled 2024-03-07: qty 1

## 2024-03-07 MED ORDER — HEPARIN SODIUM (PORCINE) 5000 UNIT/ML IJ SOLN
5000.0000 [IU] | Freq: Three times a day (TID) | INTRAMUSCULAR | Status: DC
  Administered 2024-03-07 (×2): 5000 [IU] via SUBCUTANEOUS
  Filled 2024-03-07 (×2): qty 1

## 2024-03-07 MED ORDER — SENNOSIDES-DOCUSATE SODIUM 8.6-50 MG PO TABS: 1.0000 | ORAL_TABLET | Freq: Every evening | ORAL | Status: DC | PRN

## 2024-03-07 MED ORDER — STROKE: EARLY STAGES OF RECOVERY BOOK: Freq: Once | Status: DC

## 2024-03-07 MED ORDER — ASPIRIN 81 MG PO TBEC: 81.0000 mg | DELAYED_RELEASE_TABLET | ORAL | Status: DC

## 2024-03-07 MED ORDER — OXYBUTYNIN CHLORIDE ER 5 MG PO TB24
5.0000 mg | ORAL_TABLET | Freq: Every day | ORAL | Status: DC
  Administered 2024-03-07: 5 mg via ORAL
  Filled 2024-03-07: qty 1

## 2024-03-07 MED ORDER — PANTOPRAZOLE SODIUM 40 MG PO TBEC
40.0000 mg | DELAYED_RELEASE_TABLET | Freq: Every day | ORAL | Status: DC
  Administered 2024-03-07: 40 mg via ORAL
  Filled 2024-03-07: qty 1

## 2024-03-07 MED ORDER — CLOPIDOGREL BISULFATE 75 MG PO TABS: 75.0000 mg | ORAL_TABLET | Freq: Every day | ORAL | 0 refills | Status: DC

## 2024-03-07 MED ORDER — LATANOPROST 0.005 % OP SOLN: 1.0000 [drp] | Freq: Every day | OPHTHALMIC | Status: DC

## 2024-03-07 MED ORDER — ADULT MULTIVITAMIN W/MINERALS CH
1.0000 | ORAL_TABLET | Freq: Every day | ORAL | Status: DC
Start: 2024-03-07 — End: 2024-03-07
  Administered 2024-03-07: 1 via ORAL
  Filled 2024-03-07: qty 1

## 2024-03-07 MED ORDER — CLOPIDOGREL BISULFATE 75 MG PO TABS
75.0000 mg | ORAL_TABLET | Freq: Every day | ORAL | Status: DC
  Administered 2024-03-07: 75 mg via ORAL
  Filled 2024-03-07: qty 1

## 2024-03-07 NOTE — Plan of Care (Signed)

## 2024-03-07 NOTE — Evaluation (Signed)
 Occupational Therapy Evaluation and DC Summary  Patient Details Name: Leonard Maldonado MRN: [id number] DOB: [birth year] Today's Date: 03/07/2024   History of Present Illness   Leonard Maldonado is a 73 y.o. male presenting to [facility] ED With complaint of dizziness then transferred to [facility] on 03/06/24. MRI and CT negative for acute findings. Past medical history significant of HTN, HLD,CKDIIIb, GERD Second degree AV block s/p pacemaker ,left lower extremity chronic ulcer followed by wound care.     Clinical Impressions Pt admitted for above, PTA pt reports being ind with ADLs/iADLs, driving and ambulating no AD. Pt currently presenting close to functional baseline, ambulating in room and completing ADLs independently. Pt without any symptoms of dizziness, no visual deficits, sensation deficits, or strength deficits noted bilaterally. Pt has no further acute skilled OT needs. No post acute OT needed.      If plan is discharge home, recommend the following:   Other (comment) (prn)     Functional Status Assessment   Patient has not had a recent decline in their functional status     Equipment Recommendations   None recommended by OT     Recommendations for Other Services         Precautions/Restrictions   Precautions Precautions: Fall Recall of Precautions/Restrictions: Intact Restrictions Weight Bearing Restrictions Per Provider Order: No     Mobility Bed Mobility Overal bed mobility: Independent                  Transfers Overall transfer level: Independent Equipment used: None                      Balance Overall balance assessment: No apparent balance deficits (not formally assessed)                                         ADL either performed or assessed with clinical judgement   ADL Overall ADL's : Independent                                       General ADL Comments: Pt ambulatory in room ind, completing ADLs  without challenge. Educated pt on BEFAST CVA education for general knowledge.     Vision Baseline Vision/History: 0 No visual deficits Ability to See in Adequate Light: 0 Adequate Patient Visual Report: No change from baseline Vision Assessment?: Yes Eye Alignment: Within Functional Limits Ocular Range of Motion: Within Functional Limits Alignment/Gaze Preference: Within Defined Limits Tracking/Visual Pursuits: Able to track stimulus in all quads without difficulty Saccades: Within functional limits Convergence: Within functional limits Visual Fields: No apparent deficits     Perception Perception: Within Functional Limits       Praxis Praxis: WFL       Pertinent Vitals/Pain Pain Assessment Pain Assessment: No/denies pain     Extremity/Trunk Assessment Upper Extremity Assessment Upper Extremity Assessment: Overall WFL for tasks assessed   Lower Extremity Assessment Lower Extremity Assessment: Overall WFL for tasks assessed   Cervical / Trunk Assessment Cervical / Trunk Assessment: Normal   Communication Communication Communication: No apparent difficulties   Cognition Arousal: Alert Behavior During Therapy: WFL for tasks assessed/performed Cognition: No apparent impairments  Following commands: Intact       Cueing  General Comments   Cueing Techniques: Verbal cues      Exercises     Shoulder Instructions      Home Living                                          Prior Functioning/Environment                      OT Problem List: Other (comment) (n/a; resolved)   OT Treatment/Interventions:        OT Goals(Current goals can be found in the care plan section)   Acute Rehab OT Goals OT Goal Formulation: All assessment and education complete, DC therapy Time For Goal Achievement: 03/21/24 Potential to Achieve Goals: Good   OT Frequency:       Co-evaluation               AM-PAC OT "6 Clicks" Daily Activity     Outcome Measure Help from another person eating meals?: None Help from another person taking care of personal grooming?: None Help from another person toileting, which includes using toliet, bedpan, or urinal?: None Help from another person bathing (including washing, rinsing, drying)?: None Help from another person to put on and taking off regular upper body clothing?: None Help from another person to put on and taking off regular lower body clothing?: None 6 Click Score: 24   End of Session Nurse Communication: Mobility status  Activity Tolerance: Patient tolerated treatment well Patient left: in bed;with call bell/phone within reach;with family/visitor present  OT Visit Diagnosis: Dizziness and giddiness ([id number])                Time: 1610-9604 OT Time Calculation (min): 14 min Charges:  OT General Charges $OT Visit: 1 Visit OT Evaluation $OT Eval Low Complexity: 1 Low  03/07/2024  AB, OTR/L  Acute Rehabilitation Services  Office: [phone]   Leonard Maldonado 03/07/2024, 5:59 PM

## 2024-03-07 NOTE — Plan of Care (Signed)

## 2024-03-07 NOTE — Progress Notes (Addendum)
 [facility] PROGRESS NOTE   Leonard Maldonado:[id number] DOB: [birth year] DOA: 03/06/2024  PCP: Jearldine Mina, MD  Brief History: 73 y.o. male with medical history significant of HTN, HLD,CKDIIIb, GERD Second degree AV block s/p pacemaker, left lower extremity chronic ulcer followed by wound care. Patient presents to [facility] ED With complaint of dizziness on waking this am at around 0400.  He described symptoms as room spinning.  Concern was for acute stroke.  He was hospitalized for further management.    Consultants: None as yet  Procedures: Echocardiogram is pending    Subjective/Interval History: Patient mentioned that he feels better this morning.  Was able to ambulate to the bathroom and back without difficulty.  Denies any dizziness this morning.  No previous history of vertigo.  No recent medication changes.  Does have hearing aids.  No ear pain or ear discharge.    Assessment/Plan:  Acute dizziness concerning for vertiginous episode No obvious focal neurological deficits noted on examination.  No nystagmus noted. CT head and CT angiogram did not show any acute findings. MRI is pending.  Patient does have a pacemaker which is MR compatible. Pacemaker to also be interrogated.  Cardiology notified. PT and OT evaluation. Echocardiogram. It appears that it Case was discussed with neurology by EDP but not officially consulted.  Will wait to see what the MRI shows before pursuing consultations. Patient noted to be on aspirin  twice weekly.  Patient was started on Plavix  once a day.  Continue these medications for now. LDL is 76.  HbA1c 4.8.  Chronic kidney disease stage IIIb Close to baseline.  Essential hypertension Allowing permissive hypertension.  Hyperlipidemia Continue statin.  Second-degree AV block status post pacemaker Pacemaker to be interrogated today.  Left lower extremity chronic wound Stable.  No evidence for infection.   DVT Prophylaxis:  Subcutaneous heparin  Code Status: Full code Family Communication: Discussed with patient Disposition Plan: Hopefully home when workup has been completed  Status is: Observation The patient remains OBS appropriate and may or may not d/c before 2 midnights.    Medications: Scheduled:  [START ON 03/08/2024]  stroke: early stages of recovery book   Does not apply Once   [START ON 03/10/2024] aspirin  EC  81 mg Oral Once per day on Monday Thursday   clopidogrel   75 mg Oral Daily   heparin   5,000 Units Subcutaneous Q8H   multivitamin with minerals  1 tablet Oral Daily   omega-3 acid ethyl esters  1,000 mg Oral Daily   oxybutynin   5 mg Oral Daily   pantoprazole   40 mg Oral Daily   simvastatin   20 mg Oral QHS   Continuous:  sodium chloride  40 mL/hr at 03/07/24 0800   PRN:acetaminophen , senna-docusate  Antibiotics: Anti-infectives (From admission, onward)    None       Objective:  Vital Signs  Vitals:   03/06/24 2000 03/06/24 2118 03/07/24 0427 03/07/24 0834  BP: 135/79 (!) 157/85 120/81 113/71  Pulse: 62 62 (!) 55 (!) 57  Resp: 17 18 18 18   Temp:  (!) 97.5 F (36.4 C) 97.7 F (36.5 C) 97.8 F (36.6 C)  TempSrc:  Oral Oral Oral  SpO2: 95%  98% 96%  Weight:      Height:        Intake/Output Summary (Last 24 hours) at 03/07/2024 0848 Last data filed at 03/07/2024 0800 Gross per 24 hour  Intake 132.47 ml  Output --  Net 132.47 ml   [employer]   03/06/24  0844  Weight: 95.3 kg    General appearance: Awake alert.  In no distress Resp: Clear to auscultation bilaterally.  Normal effort Cardio: S1-S2 is normal regular.  No S3-S4.  No rubs murmurs or bruit GI: Abdomen is soft.  Nontender nondistended.  Bowel sounds are present normal.  No masses organomegaly Extremities: No edema.  Full range of motion of lower extremities. Neurologic: Alert and oriented x3.  No focal neurological deficits.    Lab Results:  Data Reviewed: I have personally reviewed following labs  and reports of the imaging studies  CBC: Recent Labs  Lab 03/06/24 1015 03/07/24 0633  WBC 5.3 7.1  NEUTROABS 3.2  --   HGB 14.7 14.5  HCT 42.5 42.1  MCV 87.3 87.2  PLT 161 164    Basic Metabolic Panel: Recent Labs  Lab 03/06/24 1015 03/07/24 0633  NA 138  --   K 4.2  --   CL 104  --   CO2 27  --   GLUCOSE 98  --   BUN 16  --   CREATININE 1.46* 1.45*  CALCIUM 10.2  --     GFR: Estimated Creatinine Clearance: 52 mL/min (A) (by C-G formula based on SCr of 1.45 mg/dL (H)).  Liver Function Tests: Recent Labs  Lab 03/06/24 1015  AST 25  ALT 21  ALKPHOS 46  BILITOT 0.7  PROT 7.0  ALBUMIN 4.6    Coagulation Profile: Recent Labs  Lab 03/06/24 1015  INR 0.9    HbA1C: Recent Labs    03/07/24 0633  HGBA1C 4.8    Lipid Profile: Recent Labs    03/07/24 0633  CHOL 137  HDL 47  LDLCALC 76  TRIG 72  CHOLHDL 2.9     Radiology Studies: CT ANGIO HEAD NECK W WO CM Result Date: 03/06/2024 CLINICAL DATA:  Neuro deficit, concern for stroke, evaluation of dizziness starting this morning. EXAM: CT ANGIOGRAPHY HEAD AND NECK WITH AND WITHOUT CONTRAST TECHNIQUE: Multidetector CT imaging of the head and neck was performed using the standard protocol during bolus administration of intravenous contrast. Multiplanar CT image reconstructions and MIPs were obtained to evaluate the vascular anatomy. Carotid stenosis measurements (when applicable) are obtained utilizing NASCET criteria, using the distal internal carotid diameter as the denominator. RADIATION DOSE REDUCTION: This exam was performed according to the departmental dose-optimization program which includes automated exposure control, adjustment of the mA and/or kV according to patient size and/or use of iterative reconstruction technique. CONTRAST:  75mL OMNIPAQUE  IOHEXOL  350 MG/ML SOLN COMPARISON:  Same-day head CT. FINDINGS: CTA NECK FINDINGS Aortic arch: Standard configuration of the aortic arch. Imaged portion  shows no evidence of aneurysm or dissection. Mild atherosclerosis of the aortic arch. No significant stenosis of the major arch vessel origins. Pulmonary arteries: As permitted by contrast timing, there are no filling defects in the visualized pulmonary arteries. Subclavian arteries: The subclavian arteries are patent bilaterally. Right carotid system: No evidence of dissection, stenosis (50% or greater), or occlusion. Minimal atherosclerosis at the carotid bifurcation and along the proximal cervical ICA without stenosis. Left carotid system: No evidence of dissection, stenosis (50% or greater), or occlusion. Vertebral arteries: Codominant. No evidence of dissection, stenosis (50% or greater), or occlusion. Mild tortuosity of the left V1 segment. Skeleton: No acute findings. Degenerative changes in the cervical spine. Intervertebral disc space narrowing is most pronounced at C5-6 and C6-7. Chronic appearing deformity of the C3 inferior endplate. Other neck: The visualized airway is patent. No cervical lymphadenopathy. Upper chest: Visualized  lung apices are clear. Review of the MIP images confirms the above findings CTA HEAD FINDINGS ANTERIOR CIRCULATION: The intracranial ICAs are patent bilaterally. No significant stenosis, proximal occlusion, aneurysm, or vascular malformation. MCAs: The middle cerebral arteries are patent bilaterally. ACAs: The anterior cerebral arteries are patent bilaterally. POSTERIOR CIRCULATION: No significant stenosis, proximal occlusion, aneurysm, or vascular malformation. PCAs: The posterior cerebral arteries are patent bilaterally. Pcomm: The posterior communicating arteries are visualized bilaterally. SCAs: The superior cerebellar arteries are patent bilaterally. Basilar artery: Patent AICAs: Not well visualized. PICAs: Patent Vertebral arteries: The intracranial vertebral arteries are patent. Venous sinuses: As permitted by contrast timing, patent. Anatomic variants: None Review of the  MIP images confirms the above findings IMPRESSION: No large vessel occlusion. No high-grade stenosis, aneurysm, or vascular malformation of the arteries in the head and neck. Aortic Atherosclerosis (ICD10-I70.0). Electronically Signed   By: Denny Flack M.D.   On: 03/06/2024 14:37   CT HEAD WO CONTRAST Result Date: 03/06/2024 CLINICAL DATA:  Stroke suspected, woke up with dizziness and hypertension EXAM: CT HEAD WITHOUT CONTRAST TECHNIQUE: Contiguous axial images were obtained from the base of the skull through the vertex without intravenous contrast. RADIATION DOSE REDUCTION: This exam was performed according to the departmental dose-optimization program which includes automated exposure control, adjustment of the mA and/or kV according to patient size and/or use of iterative reconstruction technique. COMPARISON:  None Available. FINDINGS: Brain: No evidence of acute infarction, hemorrhage, hydrocephalus, extra-axial collection or mass lesion/mass effect. Ventriculomegaly with some disproportionate subarachnoid spaces but possibly congruous with a degree of atrophy. No chronic ischemic injury detected. Vascular: No hyperdense vessel or unexpected calcification. Skull: Normal. Negative for fracture or focal lesion. Sinuses/Orbits: No acute finding. IMPRESSION: No acute finding. Electronically Signed   By: Ronnette Coke M.D.   On: 03/06/2024 10:47       LOS: 0 days   Lanika Colgate Lyndon Santiago  [facility] Pager on [email or web address]  03/07/2024, 8:48 AM

## 2024-03-07 NOTE — Discharge Summary (Signed)
 [facility]  Physician Discharge Summary   Patient ID: Leonard Maldonado MRN: [id number] DOB/AGE: [birth year] 73 y.o.  Admit date: 03/06/2024 Discharge date: 03/07/2024    PCP: Jearldine Mina, MD  DISCHARGE DIAGNOSES:   TIA (transient ischemic attack)   RECOMMENDATIONS FOR OUTPATIENT FOLLOW UP: Follow up with PCP   Home Health: None Equipment/Devices: None  CODE STATUS: Full code  DISCHARGE CONDITION: fair  Diet recommendation: As before  INITIAL HISTORY: 73 y.o. male with medical history significant of HTN, HLD,CKDIIIb, GERD Second degree AV block s/p pacemaker, left lower extremity chronic ulcer followed by wound care. Patient presents to [facility] ED With complaint of dizziness on waking this am at around 0400.  He described symptoms as room spinning.  Concern was for acute stroke.  He was hospitalized for further management.     Consultants: From discussion with neurology   Procedures: Echocardiogram  HOSPITAL COURSE:   Acute dizziness concerning for vertiginous episode No obvious focal neurological deficits noted on examination.  No nystagmus noted. CT head and CT angiogram did not show any acute findings. MRI did not show any acute findings. Echocardiogram does not show any acute findings. Patient was seen by physical and Occupational Therapy with no needs identified. Discussed briefly with neurology who recommends aspirin  and Plavix  for 3 weeks followed by aspirin  on a daily basis.  Patient was previously taking aspirin  only twice weekly. LDL is 76.  Statin dose has been increased.  HbA1c 4.8. Symptoms could have been due to TIA since they resolved very quickly.  Symptoms not likely from cardiac etiology.  Ventriculomegaly/concern for NPH Patient does not have any symptoms of NPH.  This was incidentally noted on MRI brain.  No further workup per neurology.  This can be pursued further by his primary care provider.   Chronic kidney disease stage IIIb Close to  baseline.   Essential hypertension Current continue home medications   Hyperlipidemia Continue statin.   Second-degree AV block status post pacemaker    Left lower extremity chronic wound Stable.  No evidence for infection.   Patient is stable.  Okay for discharge home today.  PERTINENT LABS:  The results of significant diagnostics from this hospitalization (including imaging, microbiology, ancillary and laboratory) are listed below for reference.     Labs:   Basic Metabolic Panel: Recent Labs  Lab 03/06/24 1015 03/07/24 0633  NA 138  --   K 4.2  --   CL 104  --   CO2 27  --   GLUCOSE 98  --   BUN 16  --   CREATININE 1.46* 1.45*  CALCIUM 10.2  --    Liver Function Tests: Recent Labs  Lab 03/06/24 1015  AST 25  ALT 21  ALKPHOS 46  BILITOT 0.7  PROT 7.0  ALBUMIN 4.6    CBC: Recent Labs  Lab 03/06/24 1015 03/07/24 0633  WBC 5.3 7.1  NEUTROABS 3.2  --   HGB 14.7 14.5  HCT 42.5 42.1  MCV 87.3 87.2  PLT 161 164      IMAGING STUDIES MR Brain Wo Contrast (neuro protocol) Result Date: 03/07/2024 CLINICAL DATA:  Provided history: Neuro deficit, acute, stroke suspected. EXAM: MRI HEAD WITHOUT CONTRAST TECHNIQUE: Multiplanar, multiecho pulse sequences of the brain and surrounding structures were obtained without intravenous contrast. COMPARISON:  Non-contrast head CT and CT angiogram head/neck 03/06/2024. FINDINGS: Brain: Lateral and third ventriculomegaly, questionably disproportionate with the degree of cerebral atrophy. Acute callosal angle also noted. Chronic lacunar infarct within the  mid brain on the right (series 6, image 16). No cortical encephalomalacia is identified. No significant cerebral white matter disease for age. There is no acute infarct. No evidence of an intracranial mass. No chronic intracranial blood products. No extra-axial fluid collection. No midline shift. Vascular: Maintained flow voids within the proximal large arterial vessels. Skull  and upper cervical spine: No focal worrisome marrow lesion. Sinuses/Orbits: No mass or acute finding within the imaged orbits. Prior but ocular lens replacement. No significant paranasal sinus disease. IMPRESSION: 1. No evidence of an acute infarct. 2. Lateral and third ventriculomegaly, questionably disproportionate with the degree of cerebral atrophy. An acute callosal angle is also noted and normal pressure hydrocephalus (NPH) cannot be excluded. 3. 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 Otherwise unremarkable non-contrast MRI appearance of the brain for age. Electronically Signed   By: Bascom Lily D.O.   On: 03/07/2024 13:57   ECHOCARDIOGRAM COMPLETE Result Date: 03/07/2024    ECHOCARDIOGRAM REPORT   Patient Name:   Leonard Maldonado Date of Exam: 03/07/2024 Medical Rec #:  [id number]      Height:       74.0 in Accession #:    [id number]     Weight:       210.0 lb Date of Birth:  1951-07-19       BSA:          2.219 m Patient Age:    73 years       BP:           113/71 mmHg Patient Gender: M              HR:           64 bpm. Exam Location:  Inpatient Procedure: 2D Echo, Cardiac Doppler and Color Doppler (Both Spectral and Color            Flow Doppler were utilized during procedure). Indications:    TIA  History:        Patient has prior history of Echocardiogram examinations, most                 recent 02/17/2021. Pacemaker, Arrythmias:Bradycardia; Risk                 Factors:Dyslipidemia, Former Smoker and Hypertension. Second AV                 block.  Sonographer:    Juanita Shaw Referring Phys: SARA-MAIZ A THOMAS IMPRESSIONS  1. Left ventricular ejection fraction, by estimation, is 55 to 60%. The left ventricle has normal function. The left ventricle has no regional wall motion abnormalities. Left ventricular diastolic parameters are consistent with Grade I diastolic dysfunction (impaired relaxation).  2. Right ventricular systolic function is normal. The right ventricular size is  normal.  3. The mitral valve is normal in structure. Trivial mitral valve regurgitation. No evidence of mitral stenosis.  4. The aortic valve is calcified. There is mild calcification of the aortic valve. Aortic valve regurgitation is trivial. Aortic valve sclerosis/calcification is present, without any evidence of aortic stenosis. FINDINGS  Left Ventricle: Left ventricular ejection fraction, by estimation, is 55 to 60%. The left ventricle has normal function. The left ventricle has no regional wall motion abnormalities. The left ventricular internal cavity size was normal in size. There is  no left ventricular hypertrophy. Left ventricular diastolic parameters are consistent with Grade I diastolic dysfunction (impaired relaxation). Right Ventricle: The right ventricular size is normal. No  increase in right ventricular wall thickness. Right ventricular systolic function is normal. Left Atrium: Left atrial size was normal in size. Right Atrium: Right atrial size was normal in size. Pericardium: There is no evidence of pericardial effusion. Mitral Valve: The mitral valve is normal in structure. Trivial mitral valve regurgitation. No evidence of mitral valve stenosis. MV peak gradient, 4.5 mmHg. The mean mitral valve gradient is 2.0 mmHg. Tricuspid Valve: The tricuspid valve is normal in structure. Tricuspid valve regurgitation is trivial. No evidence of tricuspid stenosis. Aortic Valve: The aortic valve is calcified. There is mild calcification of the aortic valve. Aortic valve regurgitation is trivial. Aortic valve sclerosis/calcification is present, without any evidence of aortic stenosis. Aortic valve mean gradient measures 4.0 mmHg. Aortic valve peak gradient measures 5.4 mmHg. Aortic valve area, by VTI measures 2.75 cm. Pulmonic Valve: The pulmonic valve was normal in structure. Pulmonic valve regurgitation is trivial. No evidence of pulmonic stenosis. Aorta: The aortic root is normal in size and structure.  Venous: The inferior vena cava was not well visualized. IAS/Shunts: No atrial level shunt detected by color flow Doppler.  LEFT VENTRICLE PLAX 2D LVIDd:         4.60 cm      Diastology LVIDs:         2.90 cm      LV e' medial:    5.44 cm/s LV PW:         0.80 cm      LV E/e' medial:  10.9 LV IVS:        0.70 cm      LV e' lateral:   5.22 cm/s LVOT diam:     1.90 cm      LV E/e' lateral: 11.3 LV SV:         65 LV SV Index:   29 LVOT Area:     2.84 cm  LV Volumes (MOD) LV vol d, MOD A2C: 149.0 ml LV vol d, MOD A4C: 161.0 ml LV vol s, MOD A2C: 46.9 ml LV vol s, MOD A4C: 64.0 ml LV SV MOD A2C:     102.1 ml LV SV MOD A4C:     161.0 ml LV SV MOD BP:      102.7 ml RIGHT VENTRICLE RV Basal diam:  4.20 cm RV Mid diam:    2.80 cm RV S prime:     13.30 cm/s TAPSE (M-mode): 2.5 cm LEFT ATRIUM           Index        RIGHT ATRIUM           Index LA diam:      3.80 cm 1.71 cm/m   RA Area:     15.90 cm LA Vol (A2C): 24.7 ml 11.13 ml/m  RA Volume:   40.70 ml  18.34 ml/m LA Vol (A4C): 89.1 ml 40.15 ml/m  AORTIC VALVE                    PULMONIC VALVE AV Area (Vmax):    2.54 cm     PV Vmax:       0.98 m/s AV Area (Vmean):   2.23 cm     PV Peak grad:  3.9 mmHg AV Area (VTI):     2.75 cm AV Vmax:           116.00 cm/s AV Vmean:          91.000 cm/s AV VTI:  0.236 m AV Peak Grad:      5.4 mmHg AV Mean Grad:      4.0 mmHg LVOT Vmax:         104.00 cm/s LVOT Vmean:        71.600 cm/s LVOT VTI:          0.229 m LVOT/AV VTI ratio: 0.97  AORTA Ao Root diam: 3.40 cm Ao Asc diam:  2.90 cm MITRAL VALVE MV Area (PHT): 2.49 cm    SHUNTS MV Area VTI:   2.85 cm    Systemic VTI:  0.23 m MV Peak grad:  4.5 mmHg    Systemic Diam: 1.90 cm MV Mean grad:  2.0 mmHg MV Vmax:       1.06 m/s MV Vmean:      67.0 cm/s MV Decel Time: 305 msec MV E velocity: 59.20 cm/s MV A velocity: 96.40 cm/s MV E/A ratio:  0.61 Jules Oar MD Electronically signed by Jules Oar MD Signature Date/Time: 03/07/2024/11:13:02 AM    Final    CT ANGIO  HEAD NECK W WO CM Result Date: 03/06/2024 CLINICAL DATA:  Neuro deficit, concern for stroke, evaluation of dizziness starting this morning. EXAM: CT ANGIOGRAPHY HEAD AND NECK WITH AND WITHOUT CONTRAST TECHNIQUE: Multidetector CT imaging of the head and neck was performed using the standard protocol during bolus administration of intravenous contrast. Multiplanar CT image reconstructions and MIPs were obtained to evaluate the vascular anatomy. Carotid stenosis measurements (when applicable) are obtained utilizing NASCET criteria, using the distal internal carotid diameter as the denominator. RADIATION DOSE REDUCTION: This exam was performed according to the departmental dose-optimization program which includes automated exposure control, adjustment of the mA and/or kV according to patient size and/or use of iterative reconstruction technique. CONTRAST:  75mL OMNIPAQUE  IOHEXOL  350 MG/ML SOLN COMPARISON:  Same-day head CT. FINDINGS: CTA NECK FINDINGS Aortic arch: Standard configuration of the aortic arch. Imaged portion shows no evidence of aneurysm or dissection. Mild atherosclerosis of the aortic arch. No significant stenosis of the major arch vessel origins. Pulmonary arteries: As permitted by contrast timing, there are no filling defects in the visualized pulmonary arteries. Subclavian arteries: The subclavian arteries are patent bilaterally. Right carotid system: No evidence of dissection, stenosis (50% or greater), or occlusion. Minimal atherosclerosis at the carotid bifurcation and along the proximal cervical ICA without stenosis. Left carotid system: No evidence of dissection, stenosis (50% or greater), or occlusion. Vertebral arteries: Codominant. No evidence of dissection, stenosis (50% or greater), or occlusion. Mild tortuosity of the left V1 segment. Skeleton: No acute findings. Degenerative changes in the cervical spine. Intervertebral disc space narrowing is most pronounced at C5-6 and C6-7. Chronic  appearing deformity of the C3 inferior endplate. Other neck: The visualized airway is patent. No cervical lymphadenopathy. Upper chest: Visualized lung apices are clear. Review of the MIP images confirms the above findings CTA HEAD FINDINGS ANTERIOR CIRCULATION: The intracranial ICAs are patent bilaterally. No significant stenosis, proximal occlusion, aneurysm, or vascular malformation. MCAs: The middle cerebral arteries are patent bilaterally. ACAs: The anterior cerebral arteries are patent bilaterally. POSTERIOR CIRCULATION: No significant stenosis, proximal occlusion, aneurysm, or vascular malformation. PCAs: The posterior cerebral arteries are patent bilaterally. Pcomm: The posterior communicating arteries are visualized bilaterally. SCAs: The superior cerebellar arteries are patent bilaterally. Basilar artery: Patent AICAs: Not well visualized. PICAs: Patent Vertebral arteries: The intracranial vertebral arteries are patent. Venous sinuses: As permitted by contrast timing, patent. Anatomic variants: None Review of the MIP images confirms the above findings IMPRESSION:  No large vessel occlusion. No high-grade stenosis, aneurysm, or vascular malformation of the arteries in the head and neck. Aortic Atherosclerosis (ICD10-I70.0). Electronically Signed   By: Denny Flack M.D.   On: 03/06/2024 14:37   CT HEAD WO CONTRAST Result Date: 03/06/2024 CLINICAL DATA:  Stroke suspected, woke up with dizziness and hypertension EXAM: CT HEAD WITHOUT CONTRAST TECHNIQUE: Contiguous axial images were obtained from the base of the skull through the vertex without intravenous contrast. RADIATION DOSE REDUCTION: This exam was performed according to the departmental dose-optimization program which includes automated exposure control, adjustment of the mA and/or kV according to patient size and/or use of iterative reconstruction technique. COMPARISON:  None Available. FINDINGS: Brain: No evidence of acute infarction, hemorrhage,  hydrocephalus, extra-axial collection or mass lesion/mass effect. Ventriculomegaly with some disproportionate subarachnoid spaces but possibly congruous with a degree of atrophy. No chronic ischemic injury detected. Vascular: No hyperdense vessel or unexpected calcification. Skull: Normal. Negative for fracture or focal lesion. Sinuses/Orbits: No acute finding. IMPRESSION: No acute finding. Electronically Signed   By: Ronnette Coke M.D.   On: 03/06/2024 10:47    DISCHARGE EXAMINATION: See progress note from earlier today  DISPOSITION: Home  Discharge Instructions     Call MD for:  difficulty breathing, headache or visual disturbances   Complete by: As directed    Call MD for:  extreme fatigue   Complete by: As directed    Call MD for:  persistant dizziness or light-headedness   Complete by: As directed    Call MD for:  persistant nausea and vomiting   Complete by: As directed    Call MD for:  severe uncontrolled pain   Complete by: As directed    Call MD for:  temperature >100.4   Complete by: As directed    Diet - low sodium heart healthy   Complete by: As directed    Discharge instructions   Complete by: As directed    Please be sure to follow-up with your primary care provider and consider referral to neurology for the enlarged ventricles noted on the MRI brain.  However there is no urgency to do so since you are not experiencing any symptoms at this time.  Your PCP can guide you about this. Please take aspirin  once daily from here onwards.  Plavix  to be taken once daily just for 3 weeks. Your dose of the cholesterol medicine has also been increased.  You were cared for by a hospitalist during your hospital stay. If you have any questions about your discharge medications or the care you received while you were in the hospital after you are discharged, you can call the unit and asked to speak with the hospitalist on call if the hospitalist that took care of you is not available.  Once you are discharged, your primary care physician will handle any further medical issues. Please note that NO REFILLS for any discharge medications will be authorized once you are discharged, as it is imperative that you return to your primary care physician (or establish a relationship with a primary care physician if you do not have one) for your aftercare needs so that they can reassess your need for medications and monitor your lab values. If you do not have a primary care physician, you can call [phone] for a physician referral.   Increase activity slowly   Complete by: As directed          Allergies as of 03/07/2024   No Known Allergies  Medication List     STOP taking these medications    Arexvy  120 MCG/0.5ML injection Generic drug: RSV vaccine recomb adjuvanted       TAKE these medications    acetaminophen  325 MG tablet Commonly known as: TYLENOL  Take 2 tablets (650 mg total) by mouth every 4 (four) hours as needed for headache or mild pain.   aspirin  81 MG tablet Take 1 tablet (81 mg total) by mouth daily. What changed: when to take this   calcium carbonate 600 MG tablet Commonly known as: OS-CAL daily at 6 (six) AM.   cholecalciferol 1000 units tablet Commonly known as: VITAMIN D Take 1,000 Units by mouth daily.   clopidogrel  75 MG tablet Commonly known as: PLAVIX  Take 1 tablet (75 mg total) by mouth daily. For 3 weeks only   COENZYME Q-10 PO Take 1 tablet by mouth daily.   Emergen-C Vitamin C Pack Take 1 Dose by mouth 2 (two) times a week.   finasteride  5 MG tablet Commonly known as: PROSCAR  Take 5 mg by mouth every evening.   Fish Oil 1200 MG Caps Take 1,200 mg by mouth daily.   Magnesium 250 MG Tabs Take 250 mg by mouth 2 (two) times a week.   multivitamin tablet Take 1 tablet by mouth daily.   oxybutynin  10 MG 24 hr tablet Commonly known as: DITROPAN -XL Take 10 mg by mouth daily.   pantoprazole  40 MG tablet Commonly known as:  PROTONIX  Take 40 mg by mouth 2 (two) times daily.   sildenafil 20 MG tablet Commonly known as: REVATIO Take 20-40 mg by mouth as needed (for ED).   simvastatin  40 MG tablet Commonly known as: ZOCOR  Take 1 tablet (40 mg total) by mouth at bedtime. What changed:  medication strength how much to take   tamsulosin  0.4 MG Caps capsule Commonly known as: FLOMAX  Take 0.4 mg by mouth 2 (two) times daily.           TOTAL DISCHARGE TIME: 35 minutes  Neidy Guerrieri [employer] on [email or web address]  03/08/2024, 10:54 AM

## 2024-03-07 NOTE — Progress Notes (Signed)
 SLP Cancellation Note  Patient Details Name: Leonard Maldonado MRN: [id number] DOB: 1951-07-13   Cancelled treatment:       Reason Eval/Treat Not Completed: SLP screened. Pt has not experienced any acute changes to cognitive-linguistic function per pt and his family. No needs identified, will sign off but please re-consult if needs arise.   Amil Kale, M.A., CCC-SLP Speech Language Pathology, Acute Rehabilitation Services  Secure Chat preferred [phone]  03/07/2024, 5:09 PM

## 2024-03-07 NOTE — Plan of Care (Signed)
 Problem: Education: Goal: Knowledge of General Education information will improve Description: Including pain rating scale, medication(s)/side effects and non-pharmacologic comfort measures 03/07/2024 0601 by Durand Gift, RN Outcome: Progressing 03/07/2024 0012 by Durand Gift, RN Outcome: Progressing   Problem: Health Behavior/Discharge Planning: Goal: Ability to manage health-related needs will improve 03/07/2024 0601 by Durand Gift, RN Outcome: Progressing 03/07/2024 0012 by Durand Gift, RN Outcome: Progressing   Problem: Clinical Measurements: Goal: Ability to maintain clinical measurements within normal limits will improve 03/07/2024 0601 by Durand Gift, RN Outcome: Progressing 03/07/2024 0012 by Durand Gift, RN Outcome: Progressing Goal: Will remain free from infection 03/07/2024 0601 by Durand Gift, RN Outcome: Progressing 03/07/2024 0012 by Durand Gift, RN Outcome: Progressing Goal: Diagnostic test results will improve 03/07/2024 0601 by Durand Gift, RN Outcome: Progressing 03/07/2024 0012 by Durand Gift, RN Outcome: Progressing Goal: Respiratory complications will improve 03/07/2024 0601 by Durand Gift, RN Outcome: Progressing 03/07/2024 0012 by Durand Gift, RN Outcome: Progressing Goal: Cardiovascular complication will be avoided 03/07/2024 0601 by Durand Gift, RN Outcome: Progressing 03/07/2024 0012 by Durand Gift, RN Outcome: Progressing   Problem: Activity: Goal: Risk for activity intolerance will decrease 03/07/2024 0601 by Durand Gift, RN Outcome: Progressing 03/07/2024 0012 by Durand Gift, RN Outcome: Progressing   Problem: Nutrition: Goal: Adequate nutrition will be maintained 03/07/2024 0601 by Durand Gift, RN Outcome: Progressing 03/07/2024 0012 by Durand Gift, RN Outcome: Progressing   Problem:  Coping: Goal: Level of anxiety will decrease 03/07/2024 0601 by Durand Gift, RN Outcome: Progressing 03/07/2024 0012 by Durand Gift, RN Outcome: Progressing   Problem: Elimination: Goal: Will not experience complications related to bowel motility 03/07/2024 0601 by Durand Gift, RN Outcome: Progressing 03/07/2024 0012 by Durand Gift, RN Outcome: Progressing Goal: Will not experience complications related to urinary retention 03/07/2024 0601 by Durand Gift, RN Outcome: Progressing 03/07/2024 0012 by Durand Gift, RN Outcome: Progressing   Problem: Pain Managment: Goal: General experience of comfort will improve and/or be controlled 03/07/2024 0601 by Durand Gift, RN Outcome: Progressing 03/07/2024 0012 by Durand Gift, RN Outcome: Progressing   Problem: Safety: Goal: Ability to remain free from injury will improve 03/07/2024 0601 by Durand Gift, RN Outcome: Progressing 03/07/2024 0012 by Durand Gift, RN Outcome: Progressing   Problem: Skin Integrity: Goal: Risk for impaired skin integrity will decrease 03/07/2024 0601 by Durand Gift, RN Outcome: Progressing 03/07/2024 0012 by Durand Gift, RN Outcome: Progressing   Problem: Education: Goal: Knowledge of disease or condition will improve Outcome: Progressing Goal: Knowledge of secondary prevention will improve (MUST DOCUMENT ALL) Outcome: Progressing Goal: Knowledge of patient specific risk factors will improve (DELETE if not current risk factor) Outcome: Progressing   Problem: Ischemic Stroke/TIA Tissue Perfusion: Goal: Complications of ischemic stroke/TIA will be minimized Outcome: Progressing   Problem: Coping: Goal: Will verbalize positive feelings about self Outcome: Progressing Goal: Will identify appropriate support needs Outcome: Progressing   Problem: Health Behavior/Discharge Planning: Goal: Ability to manage  health-related needs will improve Outcome: Progressing Goal: Goals will be collaboratively established with patient/family Outcome: Progressing   Problem: Self-Care: Goal: Ability to participate in self-care as condition permits will improve Outcome: Progressing Goal: Verbalization of feelings and concerns over difficulty with self-care will improve Outcome: Progressing Goal: Ability to communicate needs accurately will improve Outcome: Progressing   Problem: Nutrition: Goal: Risk of aspiration will decrease Outcome: Progressing  Goal: Dietary intake will improve Outcome: Progressing

## 2024-03-07 NOTE — Evaluation (Signed)
 Physical Therapy Brief Evaluation and Discharge Note Patient Details Name: Leonard Maldonado MRN: [id number] DOB: [birth year] Today's Date: 03/07/2024   History of Present Illness  Leonard Maldonado is a 73 y.o. male presenting to [facility] ED With complaint of dizziness. MRI pending. Past medical history significant of HTN, HLD,CKDIIIb, GERD Second degree AV block s/p pacemaker ,left lower extremity chronic ulcer followed by wound care.  Clinical Impression  Pt presents with admitting diagnosis above. Pt today was independent with all mobility. Pt scored 22/24 on DGI indicating that he is not a fall risk. PTA pt reports he was fully independent. Pt presents at or near baseline mobility. Pt has no further acute PT needs and will be signing off. Re consult PT if mobility status changes. Pt would benefit from continued mobility with mobility specialist during acute stay.        PT Assessment Patient does not need any further PT services  Assistance Needed at Discharge  PRN    Equipment Recommendations None recommended by PT  Recommendations for Other Services       Precautions/Restrictions Precautions Precautions: Fall Recall of Precautions/Restrictions: Intact Restrictions Weight Bearing Restrictions Per Provider Order: No        Mobility  Bed Mobility   Supine/Sidelying to sit: Independent Sit to supine/sidelying: Independent    Transfers Overall transfer level: Independent Equipment used: None                    Ambulation/Gait Ambulation/Gait assistance: Independent Gait Distance (Feet): 300 Feet Assistive device: None Gait Pattern/deviations: WFL(Within Functional Limits) Gait Speed: Pace WFL General Gait Details: no LOB noted. Pt reports he is ambulating slighty slower than baseline  Home Activity Instructions    Stairs Stairs: Yes Stairs assistance: Independent Stair Management: No rails, Alternating pattern, Forwards Number of Stairs: 2 General stair  comments: no LOB noted.  Modified Rankin (Stroke Patients Only) Modified Rankin (Stroke Patients Only) Pre-Morbid Rankin Score: No symptoms Modified Rankin: No symptoms      Balance Overall balance assessment: Independent               Standardized Balance Assessment Standardized Balance Assessment : Dynamic Gait Index Dynamic Gait Index Level Surface: Normal Change in Gait Speed: Mild Impairment Gait with Horizontal Head Turns: Normal Gait with Vertical Head Turns: Normal Gait and Pivot Turn: Mild Impairment Step Over Obstacle: Normal Step Around Obstacles: Normal Steps: Normal Total Score: 22      Pertinent Vitals/Pain   Pain Assessment Pain Assessment: No/denies pain     Home Living Family/patient expects to be discharged to:: Private residence Living Arrangements: Spouse/significant other Available Help at Discharge: Family;Available 24 hours/day Home Environment: Stairs to enter;Stairs in home;Rail - left  Stairs-Number of Steps: 5 Home Equipment: Shower seat - built in        Prior Function Level of Independence: Independent      UE/LE Assessment   UE ROM/Strength/Tone/Coordination: WFL    LE ROM/Strength/Tone/Coordination: [facility]      Communication   Communication Communication: No apparent difficulties     Cognition Overall Cognitive Status: Appears within functional limits for tasks assessed/performed       General Comments General comments (skin integrity, edema, etc.): VSS    Exercises     Assessment/Plan    PT Problem List         PT Visit Diagnosis Other abnormalities of gait and mobility (R26.89)    No Skilled PT Patient at baseline level of functioning;Patient is independent  with all acitivity/mobility   Co-evaluation                AMPAC 6 Clicks Help needed turning from your back to your side while in a flat bed without using bedrails?: None Help needed moving from lying on your back to sitting on the side  of a flat bed without using bedrails?: None Help needed moving to and from a bed to a chair (including a wheelchair)?: None Help needed standing up from a chair using your arms (e.g., wheelchair or bedside chair)?: None Help needed to walk in hospital room?: None Help needed climbing 3-5 steps with a railing? : None 6 Click Score: 24      End of Session Equipment Utilized During Treatment: Gait belt Activity Tolerance: Patient tolerated treatment well Patient left: in bed;with call bell/phone within reach;with family/visitor present Nurse Communication: Mobility status PT Visit Diagnosis: Other abnormalities of gait and mobility (R26.89)     Time: 4540-9811 PT Time Calculation (min) (ACUTE ONLY): 18 min  Charges:   PT Evaluation $PT Eval Moderate Complexity: 1 Mod      Giovana Faciane B, PT, DPT Acute Rehab Services [phone]   Ginger Leeth  03/07/2024, 12:13 PM

## 2024-03-07 NOTE — Progress Notes (Signed)
  Echocardiogram 2D Echocardiogram has been performed.  Analena Gama L Carman Essick RDCS 03/07/2024, 11:05 AM

## 2024-03-09 ENCOUNTER — Encounter: Payer: Self-pay | Admitting: Cardiology

## 2024-03-10 DIAGNOSIS — R42 Dizziness and giddiness: Secondary | ICD-10-CM | POA: Diagnosis not present

## 2024-03-10 DIAGNOSIS — I639 Cerebral infarction, unspecified: Secondary | ICD-10-CM | POA: Diagnosis not present

## 2024-03-10 DIAGNOSIS — G9389 Other specified disorders of brain: Secondary | ICD-10-CM | POA: Diagnosis not present

## 2024-03-10 DIAGNOSIS — G459 Transient cerebral ischemic attack, unspecified: Secondary | ICD-10-CM | POA: Diagnosis not present

## 2024-03-11 ENCOUNTER — Ambulatory Visit: Attending: Physician Assistant | Admitting: Physician Assistant

## 2024-03-11 ENCOUNTER — Encounter: Payer: Self-pay | Admitting: Physician Assistant

## 2024-03-11 VITALS — BP 118/58 | HR 76 | Ht 74.0 in | Wt 209.4 lb

## 2024-03-11 DIAGNOSIS — Z8673 Personal history of transient ischemic attack (TIA), and cerebral infarction without residual deficits: Secondary | ICD-10-CM | POA: Diagnosis not present

## 2024-03-11 DIAGNOSIS — I441 Atrioventricular block, second degree: Secondary | ICD-10-CM

## 2024-03-11 NOTE — Patient Instructions (Addendum)
 Medication Instructions:  Your physician recommends that you continue on your current medications as directed. Please refer to the Current Medication list given to you today.  *If you need a refill on your cardiac medications before your next appointment, please call your pharmacy*  Lab Work: None ordered  If you have labs (blood work) drawn today and your tests are completely normal, you will receive your results only by: MyChart Message (if you have MyChart) OR A paper copy in the mail If you have any lab test that is abnormal or we need to change your treatment, we will call you to review the results.  Testing/Procedures: [employer] Cardiac Event Monitor Instructions  Your physician has requested you wear your cardiac event monitor for 30 days, (1-30). [employer] may call or text to confirm a shipping address. The monitor will be sent to a land address via UPS. [employer] will not ship a monitor to a PO BOX. It typically takes 3-5 days to receive your monitor after it has been enrolled. [employer] will assist with USPS tracking if your package is delayed. The telephone number for [employer] is [phone]. Once you have received your monitor, please review the enclosed instructions. Instruction tutorials can also be viewed under help and settings on the enclosed cell phone. Your monitor has already been registered assigning a specific monitor serial # to you.  Billing and Self Pay Discount Information  [employer] has been provided the insurance information we had on file for you.  If your insurance has been updated, please call [employer] at [phone] to provide them with your updated insurance information.   [employer] offers a discounted Self Pay option for patients who have insurance that does not cover their cardiac event monitor or patients without insurance.  The discounted cost of a Self Pay Cardiac Event Monitor would be $225.00 , if the patient contacts [employer] at  [phone] within 7 days of applying the monitor to make payment arrangements.  If the patient does not contact [employer] within 7 days of applying the monitor, the cost of the cardiac event monitor will be $350.00.  Applying the monitor  Remove cell phone from case and turn it on. The cell phone works as IT consultant and needs to be within [employer] of you at all times. The cell phone will need to be charged on a daily basis. We recommend you plug the cell phone into the enclosed charger at your bedside table every night.  Monitor batteries: You will receive two monitor batteries labelled #1 and #2. These are your recorders. Plug battery #2 onto the second connection on the enclosed charger. Keep one battery on the charger at all times. This will keep the monitor battery deactivated. It will also keep it fully charged for when you need to switch your monitor batteries. A small light will be blinking on the battery emblem when it is charging. The light on the battery emblem will remain on when the battery is fully charged.  Open package of a Monitor strip. Insert battery #1 into black hood on strip and gently squeeze monitor battery onto connection as indicated in instruction booklet. Set aside while preparing skin.  Choose location for your strip, vertical or horizontal, as indicated in the instruction booklet. Shave to remove all hair from location. There cannot be any lotions, oils, powders, or colognes on skin where monitor is to be applied. Wipe skin clean with enclosed Saline wipe. Dry skin completely.  Peel paper labeled #1 off the back of  the Monitor strip exposing the adhesive. Place the monitor on the chest in the vertical or horizontal position shown in the instruction booklet. One arrow on the monitor strip must be pointing upward. Carefully remove paper labeled #2, attaching remainder of strip to your skin. Try not to create any folds or wrinkles in the strip as you apply  it.  Firmly press and release the circle in the center of the monitor battery. You will hear a small beep. This is turning the monitor battery on. The heart emblem on the monitor battery will light up every 5 seconds if the monitor battery in turned on and connected to the patient securely. Do not push and hold the circle down as this turns the monitor battery off. The cell phone will locate the monitor battery. A screen will appear on the cell phone checking the connection of your monitor strip. This may read poor connection initially but change to good connection within the next minute. Once your monitor accepts the connection you will hear a series of 3 beeps followed by a climbing crescendo of beeps. A screen will appear on the cell phone showing the two monitor strip placement options. Touch the picture that demonstrates where you applied the monitor strip.  Your monitor strip and battery are waterproof. You are able to shower, bathe, or swim with the monitor on. They just ask you do not submerge deeper than 3 feet underwater. We recommend removing the monitor if you are swimming in a lake, river, or ocean.  Your monitor battery will need to be switched to a fully charged monitor battery approximately once a week. The cell phone will alert you of an action which needs to be made.  On the cell phone, tap for details to reveal connection status, monitor battery status, and cell phone battery status. The green dots indicates your monitor is in good status. A red dot indicates there is something that needs your attention.  To record a symptom, click the circle on the monitor battery. In 30-60 seconds a list of symptoms will appear on the cell phone. Select your symptom and tap save. Your monitor will record a sustained or significant arrhythmia regardless of you clicking the button. Some patients do not feel the heart rhythm irregularities. [employer] will notify us  of any serious or  critical events.  Refer to instruction booklet for instructions on switching batteries, changing strips, the Do not disturb or Pause features, or any additional questions.  Call [employer] at [phone], to confirm your monitor is transmitting and record your baseline. They will answer any questions you may have regarding the monitor instructions at that time.  Returning the monitor to [employer]  Place all equipment back into blue box. Peel off strip of paper to expose adhesive and close box securely. There is a prepaid UPS shipping label on this box. Drop in a UPS drop box, or at a UPS facility like [employer]. You may also contact [employer] to arrange UPS to pick up monitor package at your home.   Follow-Up: At [facility], you and your health needs are our priority.  As part of our continuing mission to provide you with exceptional heart care, our providers are all part of one team.  This team includes your primary Cardiologist (physician) and Advanced Practice Providers or APPs (Physician Assistants and Nurse Practitioners) who all work together to provide you with the care you need, when you need it.  Your next appointment:   3 month(s)  Provider:   Agatha Horsfall, MD    We recommend signing up for the patient portal called "MyChart".  Sign up information is provided on this After Visit Summary.  MyChart is used to connect with patients for Virtual Visits (Telemedicine).  Patients are able to view lab/test results, encounter notes, upcoming appointments, etc.  Non-urgent messages can be sent to your provider as well.   To learn more about what you can do with MyChart, go to [email or web address].   Other Instructions

## 2024-03-11 NOTE — Assessment & Plan Note (Signed)
 S/p Leadless PPM. He has noted shortness of breath since his implant. His EF is normal. He has not had chest pain. His MPI was low risk in 2022. He does not have any signs or symptoms of CHF. I will check with Dr. Lawana Pray to see if there are any changes that can be made to his device settings.

## 2024-03-11 NOTE — Progress Notes (Signed)
 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  History of Present Illness Leonard Maldonado is a 73 y.o. male who returns for post hospital follow up.  He was last seen by Dr. Lawana Pray 09/18/2023.  He was admitted 4/24-4/25 with dizziness. He was initially seen at [facility]. Systolic BP was 169 mmHg. There was concern for stroke and he was transferred to [facility]. Head CT, Brain MRI were unremarkable. There were no significant stenoses it the carotids. Echocardiogram showed normal EF. He was seen by PT/OT. No needs were identified. Symptoms were felt to be vertiginous in nature. Due to the possibility of TIA (symptoms resolved quickly), IM d/w Neuro who recommended ASA and Clopidogrel  x 3 weeks, then ASA once daily. Statin dose was increased. Of note, ventriculomegaly was noted on MRI. Notes indicate this was d/w Neuro and no further work up was needed (no symptoms of NPH). He should follow up with primary care.   He is here with his wife. He experienced a sudden onset of dizziness while getting out of bed during a [place] trip [place] the mountains. The sensation was described as his whole body 'locked' and went sideways, causing instability and a feeling of potentially falling back onto the bed. There was no spinning sensation. The dizziness resolved quickly, and he has  been symptom-free since the episode.He has a history of shortness of breath, which he has experienced since implantation of his pacemaker. He becomes winded during physical activities such as working in the yard, a symptom present since the pacemaker was placed in 2020. No chest pain, palpitations, or syncope. He quit smoking over 40 years ago.  He has been referred to neurology.  His appointment has not been scheduled yet.  He saw primary care after he went to the hospital and was given a new prescription for Plavix .  He was told to remain on this for now.   ROS-See HPI    Studies Reviewed:        Results    Risk Assessment/Calculations:             Physical Exam:   VS:  BP (!) 118/58   Pulse 76   Ht 6\' 2"  (1.88 m)   Wt 209 lb 6.4 oz (95 kg)   SpO2 97%   BMI 26.89 kg/m    Wt Readings from Last 3 Encounters:  03/11/24 209 lb 6.4 oz (95 kg)  03/06/24 210 lb (95.3 kg)  10/27/23 199 lb (90.3 kg)    Constitutional:      Appearance: Healthy appearance. Not in distress.  Neck:     Vascular: JVD normal.  Pulmonary:     Breath sounds: Normal breath sounds. No wheezing. No rales.  Cardiovascular:     Normal rate. Regular rhythm.  Murmurs: There is no murmur.  Edema:    Peripheral edema absent.  Abdominal:     Palpations: Abdomen is soft.       Assessment and Plan:   Assessment & Plan History of TIA (transient ischemic attack) Recent episode of dizziness and imbalance.  This was felt to be possibly be a TIA. Symptoms resolved quickly. MRI and CT of the brain were normal. Echocardiogram showed normal heart function and no LV thrombus. Differential includes TIA versus atypical vertigo. Neurology follow-up is pending. He was to be on Clopidogrel  for 3 weeks. His PCP refilled this. It is reasonable to remain on it until he is seen by Neuro. He is wondering when he can drive. Since his MRI and CT did not show anything acute, I think he can probably drive in 2 weeks. I reviewed his  device with our device clinic. His device cannot detect AFib.  - Continue ASA 81 mg once daily, Clopidogrel  75 mg once daily until neurology evaluation. - Follow up with neurology for further evaluation of possible TIA   - Order 30-day cardiac monitor to assess for atrial fibrillation. - Note given to resume driving after 2 weeks Second degree AV block S/p Leadless PPM. He has noted shortness of breath since his implant. His EF is normal. He has not had chest pain. His MPI was low risk in 2022. He does not have any signs or symptoms of CHF. I will check with Dr. Lawana Pray to see if there are any changes that can be made to his device settings.         Dispo:  Return in about 3 months (around 06/10/2024) for Dr. Lawana Pray.  Signed, Marlyse Single, PA-C

## 2024-03-12 ENCOUNTER — Encounter: Payer: Self-pay | Admitting: *Deleted

## 2024-03-12 ENCOUNTER — Encounter: Payer: Self-pay | Admitting: Neurology

## 2024-03-12 ENCOUNTER — Other Ambulatory Visit: Payer: Self-pay | Admitting: Physician Assistant

## 2024-03-12 DIAGNOSIS — R42 Dizziness and giddiness: Secondary | ICD-10-CM

## 2024-03-12 DIAGNOSIS — Z8673 Personal history of transient ischemic attack (TIA), and cerebral infarction without residual deficits: Secondary | ICD-10-CM

## 2024-03-12 DIAGNOSIS — I441 Atrioventricular block, second degree: Secondary | ICD-10-CM

## 2024-03-12 NOTE — Progress Notes (Unsigned)
 Patient enrolled for [facility] to ship a cardiac event monitor to his address on file.  Dr. Lawana Pray to read.

## 2024-03-18 DIAGNOSIS — R42 Dizziness and giddiness: Secondary | ICD-10-CM | POA: Diagnosis not present

## 2024-03-18 DIAGNOSIS — I441 Atrioventricular block, second degree: Secondary | ICD-10-CM | POA: Diagnosis not present

## 2024-03-18 DIAGNOSIS — G459 Transient cerebral ischemic attack, unspecified: Secondary | ICD-10-CM | POA: Diagnosis not present

## 2024-03-19 ENCOUNTER — Telehealth: Payer: Self-pay

## 2024-03-19 NOTE — Telephone Encounter (Signed)
 Spoke with patient concerning event monitor alert on 03/19/24 at 12:11 am. Patient states he was sleeping during that time frame and hasn't noticed any dizziness or palpitations since monitor applied. Reviewed monitor alert with Dr Katheryne Pane (DOD) difficult to diagnose with AF based on data provided. Also reviewed with Dr Carolynne Citron in office, unable to diagnose AF but recommends watchful waiting since patient is still wearing monitor. No changes in plan of care at this time. Will forward to Dr Lawana Pray.

## 2024-03-19 NOTE — Telephone Encounter (Signed)
 Spoke with patient, he will send a remote transmission tonight for device clinic to review tomorrow.

## 2024-03-20 ENCOUNTER — Encounter: Payer: Self-pay | Admitting: Cardiology

## 2024-03-20 NOTE — Telephone Encounter (Signed)
 Remote transmission received. Unfortunately, we are not able to view if patient is in AF or any hx of AF on this device. Also, no diagnostic data available.

## 2024-03-24 ENCOUNTER — Encounter: Payer: Self-pay | Admitting: Cardiology

## 2024-03-28 ENCOUNTER — Telehealth: Payer: Self-pay

## 2024-03-28 ENCOUNTER — Other Ambulatory Visit: Payer: Self-pay

## 2024-03-28 MED ORDER — APIXABAN 5 MG PO TABS: 5.0000 mg | ORAL_TABLET | Freq: Two times a day (BID) | ORAL | 0 refills | Status: DC

## 2024-03-28 MED ORDER — APIXABAN 5 MG PO TABS: 5.0000 mg | ORAL_TABLET | Freq: Two times a day (BID) | ORAL | 0 refills | Status: AC

## 2024-03-28 NOTE — Telephone Encounter (Signed)
 Called pt to discuss Critical Monitor Alert; left a message to call back.      Cardiac Monitor Alert  Date of alert:  03/28/2024   Patient Name: Leonard Maldonado  DOB: February 21, 1951  MRN: [id number]   [facility] Cardiologist: None  [facility] EP:  Will Cortland Ding, MD    Monitor Information: Cardiac Event Monitor [[employer]]  Reason:  History TIA  Ordering provider:  Marlyse Single, PA   Alert Atrial Fibrillation/Flutter This is the 1st alert for this rhythm.  The patient has no hx of Atrial Fibrillation/Flutter.  The patient is not currently on anticoagulation.  Next Cardiology Appointment Date:  06/12/24  Provider:  Dr. Lawana Pray  The patient could NOT be reached by telephone today.  Called twice left a message to contact office as soon as possible.  DOD Dr. Berry Bristol ordered pt to start Eliquis 5 mg PO BID and f/u as scheduled   Other:   Christine Cozier, RN  03/28/2024 8:43 AM

## 2024-03-28 NOTE — Telephone Encounter (Signed)
 Marlyse Single advises pt can be seen in 3-4 weeks.  Called spouse scheduled fon 04/25/24 at 10:55 am.

## 2024-03-28 NOTE — Telephone Encounter (Signed)
 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   Reports they were getting ready for bed around the time of event 03/27/24 at 10:29 PM. Advised of DOD recommendation to start Eliquis 5 mg PO BID.  Reviewed safety precautions.  Spouse reports she takes Xarelto and is familiar with precautions.   Spouse would like to know if pt should have an earlier appointment with provider.  Advised will send to provider to f/u.

## 2024-03-28 NOTE — Telephone Encounter (Signed)
 Pt spouse returning call.

## 2024-03-28 NOTE — Telephone Encounter (Signed)
 I have briefly reviewed the chart, patient has history of TIA and new onset of documented atrial fibrillation noted on extended EKG monitoring and was advised to start Eliquis and to follow-up with his primary cardiologist to make final decision.     Knox Perl, MD, [facility] 03/28/2024, 5:11 PM [facility] [address] [place], [place] [address] Phone: [phone]. Fax:  [phone]

## 2024-03-29 ENCOUNTER — Encounter: Payer: Self-pay | Admitting: Cardiology

## 2024-03-31 ENCOUNTER — Encounter: Payer: Self-pay | Admitting: Cardiology

## 2024-03-31 DIAGNOSIS — H401131 Primary open-angle glaucoma, bilateral, mild stage: Secondary | ICD-10-CM | POA: Diagnosis not present

## 2024-03-31 NOTE — Telephone Encounter (Signed)
 Good morning, pt on short term plavix  then switch to ASA. Please review and advise

## 2024-04-02 MED ORDER — APIXABAN 5 MG PO TABS: 5.0000 mg | ORAL_TABLET | Freq: Two times a day (BID) | ORAL | 1 refills | Status: DC

## 2024-04-16 ENCOUNTER — Ambulatory Visit (INDEPENDENT_AMBULATORY_CARE_PROVIDER_SITE_OTHER): Payer: Medicare PPO

## 2024-04-16 DIAGNOSIS — I441 Atrioventricular block, second degree: Secondary | ICD-10-CM

## 2024-04-16 LAB — CUP PACEART REMOTE DEVICE CHECK
Battery Remaining Longevity: 69 mo
Battery Voltage: 2.96 V
Brady Statistic AS VP Percent: 86.9 %
Brady Statistic AS VS Percent: 0 %
Brady Statistic RV Percent Paced: 99.98 %
Date Time Interrogation Session: 20250604052100
Implantable Pulse Generator Implant Date: 20201103
Lead Channel Impedance Value: 530 Ohm
Lead Channel Pacing Threshold Amplitude: 0.5 V
Lead Channel Pacing Threshold Pulse Width: 0.24 ms
Lead Channel Sensing Intrinsic Amplitude: 11.138 mV
Lead Channel Setting Pacing Amplitude: 1 V
Lead Channel Setting Pacing Pulse Width: 0.24 ms
Lead Channel Setting Sensing Sensitivity: 2 mV

## 2024-04-21 ENCOUNTER — Ambulatory Visit: Attending: Physician Assistant

## 2024-04-21 DIAGNOSIS — Z8673 Personal history of transient ischemic attack (TIA), and cerebral infarction without residual deficits: Secondary | ICD-10-CM

## 2024-04-21 DIAGNOSIS — R42 Dizziness and giddiness: Secondary | ICD-10-CM | POA: Diagnosis not present

## 2024-04-21 DIAGNOSIS — G459 Transient cerebral ischemic attack, unspecified: Secondary | ICD-10-CM

## 2024-04-21 DIAGNOSIS — I441 Atrioventricular block, second degree: Secondary | ICD-10-CM

## 2024-04-22 ENCOUNTER — Ambulatory Visit: Payer: Self-pay | Admitting: Physician Assistant

## 2024-04-25 ENCOUNTER — Ambulatory Visit: Payer: Self-pay | Admitting: Cardiology

## 2024-04-25 ENCOUNTER — Ambulatory Visit: Admitting: Physician Assistant

## 2024-05-05 NOTE — Progress Notes (Signed)
 Initial neurology clinic note  MILAM ALLBAUGH MRN: [id number] DOB: Dec 08, 1950  Referring provider: Ransom Other, MD  Primary care provider: Ransom Other, MD  Reason for consult:  acute dizziness  Subjective:  This is Leonard Maldonado, a 73 y.o. 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 The patient is accompanied by wife.  Patient was camping in [place] and went to get out of bed on 03/06/24 and felt very lightheaded/dizzy and had difficulty getting out of bed. It was not room spinning and not boat rocking sensation. He has difficulty describing his symptoms. When he got up, he was lightheaded and felt off. His wife drove him to [place]. He was walking slow and cautious per wife. They went to [facility] ED and was sent to [facility] and admitted overnight. His symptoms resolved within 30 minutes and have not recurred. CTH, CTA, and MRI brain showed no acute process. MRI brain did show mildly enlarged ventricles per radiology and ?NPH. Echocardiogram was normal. There was concern for TIA so patient was put on DAPT with asa and plavix  for 21 days. He took plavix  until 5/15 then continued asa monotherapy. He is on simvastatin  40 mg daily for HLD.  Cardiology detected afib on extended EKG. He was started on eliquis  03/28/24 and stopped aspirin .  He is a former smoker and quit 50 years ago.   EtOH use: Rare Restrictive diet: no Family history of neurologic problems: mother with Alzheimer's dementia  NPH questions: Urinary symptoms? None Cognitive changes? No per husband. Per wife, he gets confused with directions with driving sometimes. He doesn't know his calendar sometimes either. Imbalance? None  MEDICATIONS:  Outpatient Encounter Medications as of 05/15/2024  Medication Sig   acetaminophen  (TYLENOL ) 325 MG  tablet Take 2 tablets (650 mg total) by mouth every 4 (four) hours as needed for headache or mild pain.   acetaminophen  (TYLENOL ) 650 MG CR tablet Take 650 mg by mouth every 8 (eight) hours as needed for pain.   apixaban  (ELIQUIS ) 5 MG TABS tablet Take 1 tablet (5 mg total) by mouth 2 (two) times daily. This is a sample   aspirin  81 MG tablet Take 1 tablet (81 mg total) by mouth daily.   calcium carbonate (OS-CAL) 600 MG tablet daily at 6 (six) AM.   cholecalciferol (VITAMIN D) 1000 UNITS tablet Take 1,000 Units by mouth daily.   COENZYME Q-10 PO Take 1 tablet by mouth daily.   finasteride  (PROSCAR ) 5 MG tablet Take 5 mg by mouth every evening.    Multiple Vitamin (MULTIVITAMIN) tablet Take 1 tablet by mouth daily.   Multiple Vitamins-Minerals (EMERGEN-C VITAMIN C) PACK Take 1 Dose by mouth 2 (two) times a week.   oxybutynin  (DITROPAN -XL) 10 MG 24 hr tablet Take 10 mg by mouth daily.   pantoprazole  (PROTONIX ) 40 MG tablet Take 40 mg by mouth 2 (two) times daily.   sildenafil (REVATIO) 20 MG tablet Take 20-40 mg by mouth as needed (for ED).    simvastatin  (ZOCOR ) 40 MG tablet Take 1 tablet (40 mg total) by mouth at bedtime.   tamsulosin  (FLOMAX ) 0.4 MG CAPS capsule Take 0.4 mg by mouth 2 (two) times daily.    apixaban  (ELIQUIS ) 5 MG TABS tablet Take 1 tablet (5 mg total) by mouth 2 (two) times daily. This is a sample (Patient not taking: Reported on  05/15/2024)   apixaban  (ELIQUIS ) 5 MG TABS tablet Take 1 tablet (5 mg total) by mouth 2 (two) times daily. (Patient not taking: Reported on 05/15/2024)   clopidogrel  (PLAVIX ) 75 MG tablet Take 1 tablet (75 mg total) by mouth daily. For 3 weeks only (Patient not taking: Reported on 05/15/2024)   Magnesium 250 MG TABS Take 250 mg by mouth 2 (two) times a week. (Patient not taking: Reported on 05/15/2024)   Omega-3 Fatty Acids (FISH OIL) 1200 MG CAPS Take 1,200 mg by mouth daily.  (Patient not taking: Reported on 05/15/2024)   No facility-administered encounter  medications on file as of 05/15/2024.    PAST MEDICAL HISTORY: Past Medical History:  Diagnosis Date   HTN (hypertension)    Hyperlipemia    Kidney calculus    Second degree AV block     PAST SURGICAL HISTORY: Past Surgical History:  Procedure Laterality Date   COLONOSCOPY WITH PROPOFOL  N/A 11/04/2015   Procedure: COLONOSCOPY WITH PROPOFOL ;  Surgeon: Gladis MARLA Louder, MD;  Location: [facility];  Service: Endoscopy;  Laterality: N/A;   EXTRACORPOREAL SHOCK WAVE LITHOTRIPSY Left 10/10/2021   Procedure: EXTRACORPOREAL SHOCK WAVE LITHOTRIPSY (ESWL);  Surgeon: Carolee Sherwood JONETTA DOUGLAS, MD;  Location: [facility];  Service: Urology;  Laterality: Left;   PACEMAKER LEADLESS INSERTION N/A 09/16/2019   Procedure: PACEMAKER LEADLESS INSERTION;  Surgeon: Kelsie Agent, MD;  Location: [facility];  Service: Cardiovascular;  Laterality: N/A;   SHOULDER SURGERY      ALLERGIES: No Known Allergies  FAMILY HISTORY: Family History  Problem Relation Age of Onset   Emphysema Father    Lung cancer Father     SOCIAL HISTORY: Social History   Tobacco Use   Smoking status: Former    Current packs/day: 0.00    Average packs/day: 1 pack/day for 10.0 years (10.0 ttl pk-yrs)    Types: Cigarettes    Start date: 03/18/1962    Quit date: 03/18/1972    Years since quitting: 52.1   Smokeless tobacco: Never  Vaping Use   Vaping status: Never Used  Substance Use Topics   Alcohol use: Yes    Comment: occasional   Drug use: No   Social History   Social History Narrative   Are you right handed or left handed? Right   Are you currently employed ?    What is your current occupation? Retired   Do you live at home alone?   Who lives with you? wife   What type of home do you live in: 1 story or 2 story? One and a half   Caffeine  none     Objective:  Vital Signs:  BP 113/68   Pulse 78   Ht 6' 2 (1.88 m)   Wt 207 lb (93.9 kg)   SpO2 97%   BMI 26.58 kg/m   General: No acute  distress.  Patient appears well-groomed.   Head:  Normocephalic/atraumatic Neck: supple Heart: regular rate and rhythm Lungs: Clear to auscultation bilaterally. Vascular: No carotid bruits.  Neurological Exam: Mental status: alert and oriented, speech fluent and not dysarthric, language intact.  MoCA: Visuospatial/executive: 3/5 (points lost for number/letter line and cube) Naming: 3/3 Attention: 2/2, 1/1, 2/3 Language: 2/2, 0/1 (7 F words) Abstraction: 2/2 Delayed recall: 0/5 Orientation: 6/6 Total 21/30  Cranial nerves: CN I: not tested CN II: pupils equal, round and reactive to light, visual fields intact CN III, IV, VI:  full range of motion, no nystagmus, no ptosis  CN V: facial sensation intact. CN VII: upper and lower face symmetric CN VIII: hearing intact CN IX, X: uvula midline CN XI: sternocleidomastoid and trapezius muscles intact CN XII: tongue midline  Bulk & Tone: normal, no fasciculations. Motor:  muscle strength 5/5 throughout Deep Tendon Reflexes:  2+ throughout.   Sensation:  Pinprick sensation intact. Finger to nose testing:  Without dysmetria.   Gait:  Normal station and stride. No imbalance. No magnetic gait. Romberg negative.   Labs and Imaging review: Internal labs: 03/07/24: Lipid panel: tChol 137, LDL 76, TG 72 HbA1c: 4.8 CBC unremarkable CMP significant for Cr 1.46 (baseline 1.3-1.6)  TSH (09/16/2019): elevated to 4.702  Imaging/Procedures: CT head wo contrast (03/06/24): FINDINGS: Brain: No evidence of acute infarction, hemorrhage, hydrocephalus, extra-axial collection or mass lesion/mass effect. Ventriculomegaly with some disproportionate subarachnoid spaces but possibly congruous with a degree of atrophy. No chronic ischemic injury detected.   Vascular: No hyperdense vessel or unexpected calcification.   Skull: Normal. Negative for fracture or focal lesion.   Sinuses/Orbits: No acute finding.   IMPRESSION: No acute  finding.  CTA head and neck (03/06/24): IMPRESSION: No large vessel occlusion.   No high-grade stenosis, aneurysm, or vascular malformation of the arteries in the head and neck.   Aortic Atherosclerosis (ICD10-I70.0).  MRI brain wo contrast (03/07/24): FINDINGS: Brain:   Lateral and third ventriculomegaly, questionably disproportionate with the degree of cerebral atrophy. Acute callosal angle also noted.   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   No cortical encephalomalacia is identified. No significant cerebral white matter disease for age.   There is no acute infarct.   No evidence of an intracranial mass.   No chronic intracranial blood products.   No extra-axial fluid collection.   No midline shift.   Vascular: Maintained flow voids within the proximal large arterial vessels.   Skull and upper cervical spine: No focal worrisome marrow lesion.   Sinuses/Orbits: No mass or acute finding within the imaged orbits. Prior but ocular lens replacement. No significant paranasal sinus disease.   IMPRESSION: 1. No evidence of an acute infarct. 2. Lateral and third ventriculomegaly, questionably disproportionate with the degree of cerebral atrophy. An acute callosal angle is also noted and normal pressure hydrocephalus (NPH) cannot be excluded. 3. 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 Otherwise unremarkable non-contrast MRI appearance of the brain for age.  Echocardiogram (03/07/24): 1. Left ventricular ejection fraction, by estimation, is 55 to 60%. The  left ventricle has normal function. The left ventricle has no regional  wall motion abnormalities. Left ventricular diastolic parameters are  consistent with Grade I diastolic  dysfunction (impaired relaxation).   2. Right ventricular systolic function is normal. The right ventricular  size is normal.   3. The mitral valve is normal in structure.  Trivial mitral valve  regurgitation. No evidence of mitral stenosis.   4. The aortic valve is calcified. There is mild calcification of the  aortic valve. Aortic valve regurgitation is trivial. Aortic valve  sclerosis/calcification is present, without any evidence of aortic  stenosis.  LA normal in size  Assessment/Plan:  Leonard Maldonado is a 73 y.o. male who presents for evaluation of transient episode of lightheadedness/dizziness. 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 His neurological examination is pertinent for MoCA of 21/30, but otherwise normal. Available diagnostic data is  significant for MRI brain that showed mildly enlarged ventricles per radiology with possible question of NPH in their read. Patient does not have characteristic magnetic gait or urinary incontinence that would support NPH. His wife has noticed cognitive changes though (not patient). His MoCA supports this. This could represent mild cognitive impairment and with a family history of Alzheimer's dementia, this could also be potential concern in the future. Regarding his transient episode of lightheadedness and dizziness that lasted less than 30 minutes, this if of unclear etiology, but likely safest to treat as TIA. Patient is on appropriate anticoagulation now for newly discovered afib and statin for HLD.  PLAN: -Blood work: TSH, B1, B12 -Discussed neuropysch testing for further work up of cognitive changes. Patient and wife to discuss and let me know if they want to pursue this. -Continue eliquis  5 mg BID -Continue simvastatin  40 mg daily -Stroke warning signs discussed  -Return to clinic in 6 months  The impression above as well as the plan as outlined below were extensively discussed with the patient (in the company of wife) who voiced understanding. All questions were answered to their satisfaction.  When  available, results of the above investigations and possible further recommendations will be communicated to the patient via telephone/MyChart. Patient to call office if not contacted after expected testing turnaround time.   Total time spent reviewing records, interview, history/exam, documentation, and coordination of care on day of encounter:  75 min   Thank you for allowing me to participate in patient's care.  If I can answer any additional questions, I would be pleased to do so.  Venetia Potters, MD   CC: Ransom Other, MD [address] [place] [address]  CC: Referring provider: Ransom Other, MD [address] [place],  [place] [address]

## 2024-05-12 DIAGNOSIS — N1831 Chronic kidney disease, stage 3a: Secondary | ICD-10-CM | POA: Diagnosis not present

## 2024-05-12 DIAGNOSIS — M47816 Spondylosis without myelopathy or radiculopathy, lumbar region: Secondary | ICD-10-CM | POA: Diagnosis not present

## 2024-05-12 DIAGNOSIS — N400 Enlarged prostate without lower urinary tract symptoms: Secondary | ICD-10-CM | POA: Diagnosis not present

## 2024-05-12 DIAGNOSIS — I10 Essential (primary) hypertension: Secondary | ICD-10-CM | POA: Diagnosis not present

## 2024-05-15 ENCOUNTER — Encounter: Payer: Self-pay | Admitting: Neurology

## 2024-05-15 ENCOUNTER — Ambulatory Visit: Admitting: Neurology

## 2024-05-15 ENCOUNTER — Other Ambulatory Visit

## 2024-05-15 VITALS — BP 113/68 | HR 78 | Ht 74.0 in | Wt 207.0 lb

## 2024-05-15 DIAGNOSIS — G459 Transient cerebral ischemic attack, unspecified: Secondary | ICD-10-CM

## 2024-05-15 DIAGNOSIS — I480 Paroxysmal atrial fibrillation: Secondary | ICD-10-CM

## 2024-05-15 DIAGNOSIS — R4189 Other symptoms and signs involving cognitive functions and awareness: Secondary | ICD-10-CM | POA: Diagnosis not present

## 2024-05-15 NOTE — Patient Instructions (Addendum)
 I saw you today for episode of lightheadedness/dizziness in 02/2024. This may have been a TIA. You are now on Eliquis  for afib, and this will help lower your stroke risk, so continue taking this. Also continue simvastatin  for cholesterol.  We also talked about your memory. Your cognitive screen was a little lower than expected today. We discussed further cognitive testing. You wanted to think about this. Let me know if you decide you want to do the cognitive testing.  I will get some blood work today to look for reversible causes. I will be in touch when I have your results.  I was asked by other doctors if you had a condition called normal pressure hydrocephalus. Your examination does not support this. I do not think this is the cause of that episode of dizziness or cognitive changes.  I will see you back in clinic in 6 months. Please let me know if you have any questions or concerns in the meantime.  If you have new difficulty speaking, face droop, numbness on one side of the body, weakness on one side of the body, or dizziness/imbalance, this could be the sign of a stroke. Don't wait, please call EMS and be evaluated at the nearest emergency room.  The physicians and staff at [facility] are committed to providing excellent care. You may receive a survey requesting feedback about your experience at our office. We strive to receive very good responses to the survey questions. If you feel that your experience would prevent you from giving the office a very good  response, please contact our office to try to remedy the situation. We may be reached at [phone]. Thank you for taking the time out of your busy day to complete the survey.  Venetia Potters, MD [facility]

## 2024-05-20 ENCOUNTER — Ambulatory Visit: Payer: Self-pay | Admitting: Neurology

## 2024-05-20 LAB — VITAMIN B1: Vitamin B1 (Thiamine): 21 nmol/L (ref 8–30)

## 2024-05-20 LAB — VITAMIN B12: Vitamin B-12: 475 pg/mL (ref 200–1100)

## 2024-05-20 LAB — TSH: TSH: 2.67 m[IU]/L (ref 0.40–4.50)

## 2024-05-29 ENCOUNTER — Encounter: Attending: Physician Assistant | Admitting: Physician Assistant

## 2024-05-29 DIAGNOSIS — S81812A Laceration without foreign body, left lower leg, initial encounter: Secondary | ICD-10-CM | POA: Insufficient documentation

## 2024-05-29 DIAGNOSIS — I480 Paroxysmal atrial fibrillation: Secondary | ICD-10-CM | POA: Insufficient documentation

## 2024-05-29 DIAGNOSIS — I10 Essential (primary) hypertension: Secondary | ICD-10-CM | POA: Diagnosis not present

## 2024-05-29 DIAGNOSIS — I87332 Chronic venous hypertension (idiopathic) with ulcer and inflammation of left lower extremity: Secondary | ICD-10-CM | POA: Insufficient documentation

## 2024-05-29 DIAGNOSIS — Z950 Presence of cardiac pacemaker: Secondary | ICD-10-CM | POA: Insufficient documentation

## 2024-05-29 DIAGNOSIS — Z7901 Long term (current) use of anticoagulants: Secondary | ICD-10-CM | POA: Insufficient documentation

## 2024-05-29 DIAGNOSIS — L97822 Non-pressure chronic ulcer of other part of left lower leg with fat layer exposed: Secondary | ICD-10-CM | POA: Diagnosis not present

## 2024-06-09 ENCOUNTER — Encounter: Admitting: Physician Assistant

## 2024-06-09 DIAGNOSIS — I480 Paroxysmal atrial fibrillation: Secondary | ICD-10-CM | POA: Diagnosis not present

## 2024-06-09 DIAGNOSIS — L97822 Non-pressure chronic ulcer of other part of left lower leg with fat layer exposed: Secondary | ICD-10-CM | POA: Diagnosis not present

## 2024-06-09 DIAGNOSIS — Z950 Presence of cardiac pacemaker: Secondary | ICD-10-CM | POA: Diagnosis not present

## 2024-06-09 DIAGNOSIS — S81812A Laceration without foreign body, left lower leg, initial encounter: Secondary | ICD-10-CM | POA: Diagnosis not present

## 2024-06-09 DIAGNOSIS — I10 Essential (primary) hypertension: Secondary | ICD-10-CM | POA: Diagnosis not present

## 2024-06-09 DIAGNOSIS — I87332 Chronic venous hypertension (idiopathic) with ulcer and inflammation of left lower extremity: Secondary | ICD-10-CM | POA: Diagnosis not present

## 2024-06-09 DIAGNOSIS — Z7901 Long term (current) use of anticoagulants: Secondary | ICD-10-CM | POA: Diagnosis not present

## 2024-06-09 NOTE — Progress Notes (Signed)
 Remote pacemaker transmission.

## 2024-06-12 ENCOUNTER — Ambulatory Visit: Attending: Cardiology | Admitting: Cardiology

## 2024-06-12 ENCOUNTER — Encounter: Payer: Self-pay | Admitting: Cardiology

## 2024-06-12 VITALS — BP 114/80 | HR 58 | Ht 74.0 in | Wt 209.9 lb

## 2024-06-12 DIAGNOSIS — I10 Essential (primary) hypertension: Secondary | ICD-10-CM | POA: Diagnosis not present

## 2024-06-12 DIAGNOSIS — I480 Paroxysmal atrial fibrillation: Secondary | ICD-10-CM

## 2024-06-12 DIAGNOSIS — N1831 Chronic kidney disease, stage 3a: Secondary | ICD-10-CM | POA: Diagnosis not present

## 2024-06-12 DIAGNOSIS — I441 Atrioventricular block, second degree: Secondary | ICD-10-CM

## 2024-06-12 DIAGNOSIS — D6869 Other thrombophilia: Secondary | ICD-10-CM | POA: Diagnosis not present

## 2024-06-12 DIAGNOSIS — N400 Enlarged prostate without lower urinary tract symptoms: Secondary | ICD-10-CM | POA: Diagnosis not present

## 2024-06-12 DIAGNOSIS — M47816 Spondylosis without myelopathy or radiculopathy, lumbar region: Secondary | ICD-10-CM | POA: Diagnosis not present

## 2024-06-12 LAB — CUP PACEART INCLINIC DEVICE CHECK
Date Time Interrogation Session: 20250731143604
Implantable Pulse Generator Implant Date: 20201103

## 2024-06-12 NOTE — Progress Notes (Signed)
  Electrophysiology Office Note:   Date:  06/12/2024  ID:  HARLEM BULA, DOB 1950/11/20, MRN [id number]  Primary Cardiologist: None Primary Heart Failure: None Electrophysiologist: Khylah Kendra Gladis Norton, MD      History of Present Illness:   Leonard Maldonado is a 73 y.o. male with h/o hypertension, hyperlipidemia, second-degree AV block seen today for routine electrophysiology followup.   Since last being seen in our clinic the patient reports doing well.  He has no chest pain or shortness of breath.  He is no acute complaints.  In April, he was in the hospital.  He had a brain MRI that showed old lacunar strokes.  He wore a cardiac monitor that showed 28 minutes of atrial fibrillation and was thus started on Eliquis .  he denies chest pain, palpitations, dyspnea, PND, orthopnea, nausea, vomiting, dizziness, syncope, edema, weight gain, or early satiety.   Review of systems complete and found to be negative unless listed in HPI.      EP Information / Studies Reviewed:    EKG is not ordered today. EKG from 03/06/2024 reviewed which showed sinus rhythm, ventricular paced      PPM Interrogation-  reviewed in detail today,  See PACEART report.  Device History: Medtronic Leadless PPM implanted 2020 for Second Degree AV block  Risk Assessment/Calculations:            Physical Exam:   VS:  There were no vitals taken for this visit.   Wt Readings from Last 3 Encounters:  05/15/24 207 lb (93.9 kg)  03/11/24 209 lb 6.4 oz (95 kg)  03/06/24 210 lb (95.3 kg)     GEN: Well nourished, well developed in no acute distress NECK: No JVD; No carotid bruits CARDIAC: Regular rate and rhythm, no murmurs, rubs, gallops RESPIRATORY:  Clear to auscultation without rales, wheezing or rhonchi  ABDOMEN: Soft, non-tender, non-distended EXTREMITIES:  No edema; No deformity   ASSESSMENT AND PLAN:    Second Degree AV block s/p Medtronic PPM  Normal PPM function See Pace Art report Sensing, threshold,  impedance within normal limits Programming appropriate No changes today  2.  Hypertension: Well-controlled  3.  Paroxysmal atrial fibrillation/secondary hypercoagulable state: On Eliquis .  Has had short episodes.  History of TIA.  Disposition:   Follow up with EP APP in 12 months  Signed, Kalea Perine Gladis Norton, MD

## 2024-06-16 ENCOUNTER — Encounter: Attending: Physician Assistant | Admitting: Physician Assistant

## 2024-06-16 DIAGNOSIS — Z0000 Encounter for general adult medical examination without abnormal findings: Secondary | ICD-10-CM | POA: Diagnosis not present

## 2024-06-16 DIAGNOSIS — Z7901 Long term (current) use of anticoagulants: Secondary | ICD-10-CM | POA: Diagnosis not present

## 2024-06-16 DIAGNOSIS — Z950 Presence of cardiac pacemaker: Secondary | ICD-10-CM | POA: Diagnosis not present

## 2024-06-16 DIAGNOSIS — Z1331 Encounter for screening for depression: Secondary | ICD-10-CM | POA: Diagnosis not present

## 2024-06-16 DIAGNOSIS — I10 Essential (primary) hypertension: Secondary | ICD-10-CM | POA: Diagnosis not present

## 2024-06-16 DIAGNOSIS — I480 Paroxysmal atrial fibrillation: Secondary | ICD-10-CM | POA: Insufficient documentation

## 2024-06-16 DIAGNOSIS — X58XXXA Exposure to other specified factors, initial encounter: Secondary | ICD-10-CM | POA: Insufficient documentation

## 2024-06-16 DIAGNOSIS — L97822 Non-pressure chronic ulcer of other part of left lower leg with fat layer exposed: Secondary | ICD-10-CM | POA: Insufficient documentation

## 2024-06-16 DIAGNOSIS — I700 Atherosclerosis of aorta: Secondary | ICD-10-CM | POA: Diagnosis not present

## 2024-06-16 DIAGNOSIS — N1831 Chronic kidney disease, stage 3a: Secondary | ICD-10-CM | POA: Diagnosis not present

## 2024-06-16 DIAGNOSIS — I87332 Chronic venous hypertension (idiopathic) with ulcer and inflammation of left lower extremity: Secondary | ICD-10-CM | POA: Diagnosis not present

## 2024-06-16 DIAGNOSIS — E7800 Pure hypercholesterolemia, unspecified: Secondary | ICD-10-CM | POA: Diagnosis not present

## 2024-06-16 DIAGNOSIS — D6869 Other thrombophilia: Secondary | ICD-10-CM | POA: Diagnosis not present

## 2024-06-16 DIAGNOSIS — N400 Enlarged prostate without lower urinary tract symptoms: Secondary | ICD-10-CM | POA: Diagnosis not present

## 2024-06-16 DIAGNOSIS — K219 Gastro-esophageal reflux disease without esophagitis: Secondary | ICD-10-CM | POA: Diagnosis not present

## 2024-06-16 DIAGNOSIS — S81812A Laceration without foreign body, left lower leg, initial encounter: Secondary | ICD-10-CM | POA: Diagnosis not present

## 2024-06-17 ENCOUNTER — Ambulatory Visit: Admitting: Physician Assistant

## 2024-06-23 ENCOUNTER — Encounter: Admitting: Physician Assistant

## 2024-06-23 DIAGNOSIS — I480 Paroxysmal atrial fibrillation: Secondary | ICD-10-CM | POA: Diagnosis not present

## 2024-06-23 DIAGNOSIS — S81812A Laceration without foreign body, left lower leg, initial encounter: Secondary | ICD-10-CM | POA: Diagnosis not present

## 2024-06-23 DIAGNOSIS — I10 Essential (primary) hypertension: Secondary | ICD-10-CM | POA: Diagnosis not present

## 2024-06-23 DIAGNOSIS — Z950 Presence of cardiac pacemaker: Secondary | ICD-10-CM | POA: Diagnosis not present

## 2024-06-23 DIAGNOSIS — I87332 Chronic venous hypertension (idiopathic) with ulcer and inflammation of left lower extremity: Secondary | ICD-10-CM | POA: Diagnosis not present

## 2024-06-23 DIAGNOSIS — Z7901 Long term (current) use of anticoagulants: Secondary | ICD-10-CM | POA: Diagnosis not present

## 2024-06-23 DIAGNOSIS — L97822 Non-pressure chronic ulcer of other part of left lower leg with fat layer exposed: Secondary | ICD-10-CM | POA: Diagnosis not present

## 2024-06-27 DIAGNOSIS — R509 Fever, unspecified: Secondary | ICD-10-CM | POA: Diagnosis not present

## 2024-06-28 ENCOUNTER — Ambulatory Visit: Payer: Self-pay

## 2024-07-07 ENCOUNTER — Encounter: Admitting: Internal Medicine

## 2024-07-07 DIAGNOSIS — S81812A Laceration without foreign body, left lower leg, initial encounter: Secondary | ICD-10-CM | POA: Diagnosis not present

## 2024-07-07 DIAGNOSIS — Z7901 Long term (current) use of anticoagulants: Secondary | ICD-10-CM | POA: Diagnosis not present

## 2024-07-07 DIAGNOSIS — I87332 Chronic venous hypertension (idiopathic) with ulcer and inflammation of left lower extremity: Secondary | ICD-10-CM | POA: Diagnosis not present

## 2024-07-07 DIAGNOSIS — I480 Paroxysmal atrial fibrillation: Secondary | ICD-10-CM | POA: Diagnosis not present

## 2024-07-07 DIAGNOSIS — Z950 Presence of cardiac pacemaker: Secondary | ICD-10-CM | POA: Diagnosis not present

## 2024-07-07 DIAGNOSIS — I10 Essential (primary) hypertension: Secondary | ICD-10-CM | POA: Diagnosis not present

## 2024-07-07 DIAGNOSIS — L97822 Non-pressure chronic ulcer of other part of left lower leg with fat layer exposed: Secondary | ICD-10-CM | POA: Diagnosis not present

## 2024-07-13 DIAGNOSIS — N400 Enlarged prostate without lower urinary tract symptoms: Secondary | ICD-10-CM | POA: Diagnosis not present

## 2024-07-13 DIAGNOSIS — M47816 Spondylosis without myelopathy or radiculopathy, lumbar region: Secondary | ICD-10-CM | POA: Diagnosis not present

## 2024-07-13 DIAGNOSIS — I10 Essential (primary) hypertension: Secondary | ICD-10-CM | POA: Diagnosis not present

## 2024-07-13 DIAGNOSIS — N1831 Chronic kidney disease, stage 3a: Secondary | ICD-10-CM | POA: Diagnosis not present

## 2024-07-15 ENCOUNTER — Encounter: Attending: Physician Assistant | Admitting: Physician Assistant

## 2024-07-15 DIAGNOSIS — I480 Paroxysmal atrial fibrillation: Secondary | ICD-10-CM | POA: Insufficient documentation

## 2024-07-15 DIAGNOSIS — I87332 Chronic venous hypertension (idiopathic) with ulcer and inflammation of left lower extremity: Secondary | ICD-10-CM | POA: Insufficient documentation

## 2024-07-15 DIAGNOSIS — S81812A Laceration without foreign body, left lower leg, initial encounter: Secondary | ICD-10-CM | POA: Diagnosis not present

## 2024-07-15 DIAGNOSIS — L97822 Non-pressure chronic ulcer of other part of left lower leg with fat layer exposed: Secondary | ICD-10-CM | POA: Insufficient documentation

## 2024-07-15 DIAGNOSIS — X58XXXA Exposure to other specified factors, initial encounter: Secondary | ICD-10-CM | POA: Insufficient documentation

## 2024-07-15 DIAGNOSIS — Z7901 Long term (current) use of anticoagulants: Secondary | ICD-10-CM | POA: Insufficient documentation

## 2024-07-15 DIAGNOSIS — Z950 Presence of cardiac pacemaker: Secondary | ICD-10-CM | POA: Insufficient documentation

## 2024-07-15 DIAGNOSIS — I10 Essential (primary) hypertension: Secondary | ICD-10-CM | POA: Diagnosis not present

## 2024-07-16 ENCOUNTER — Ambulatory Visit: Payer: Medicare PPO

## 2024-07-16 DIAGNOSIS — I480 Paroxysmal atrial fibrillation: Secondary | ICD-10-CM

## 2024-07-17 ENCOUNTER — Ambulatory Visit: Payer: Self-pay | Admitting: Cardiology

## 2024-07-17 LAB — CUP PACEART REMOTE DEVICE CHECK
Battery Remaining Longevity: 67 mo
Battery Voltage: 2.95 V
Brady Statistic AS VP Percent: 87.36 %
Brady Statistic AS VS Percent: 0 %
Brady Statistic RV Percent Paced: 99.97 %
Date Time Interrogation Session: 20250903072129
Implantable Pulse Generator Implant Date: 20201103
Lead Channel Impedance Value: 530 Ohm
Lead Channel Pacing Threshold Amplitude: 0.5 V
Lead Channel Pacing Threshold Pulse Width: 0.24 ms
Lead Channel Sensing Intrinsic Amplitude: 22.05 mV
Lead Channel Setting Pacing Amplitude: 1 V
Lead Channel Setting Pacing Pulse Width: 0.24 ms
Lead Channel Setting Sensing Sensitivity: 2 mV

## 2024-07-21 ENCOUNTER — Encounter: Admitting: Physician Assistant

## 2024-07-21 DIAGNOSIS — Z950 Presence of cardiac pacemaker: Secondary | ICD-10-CM | POA: Diagnosis not present

## 2024-07-21 DIAGNOSIS — S81812A Laceration without foreign body, left lower leg, initial encounter: Secondary | ICD-10-CM | POA: Diagnosis not present

## 2024-07-21 DIAGNOSIS — X58XXXA Exposure to other specified factors, initial encounter: Secondary | ICD-10-CM | POA: Diagnosis not present

## 2024-07-21 DIAGNOSIS — I87332 Chronic venous hypertension (idiopathic) with ulcer and inflammation of left lower extremity: Secondary | ICD-10-CM | POA: Diagnosis not present

## 2024-07-21 DIAGNOSIS — L97822 Non-pressure chronic ulcer of other part of left lower leg with fat layer exposed: Secondary | ICD-10-CM | POA: Diagnosis not present

## 2024-07-21 DIAGNOSIS — I480 Paroxysmal atrial fibrillation: Secondary | ICD-10-CM | POA: Diagnosis not present

## 2024-07-21 DIAGNOSIS — Z7901 Long term (current) use of anticoagulants: Secondary | ICD-10-CM | POA: Diagnosis not present

## 2024-07-21 DIAGNOSIS — I10 Essential (primary) hypertension: Secondary | ICD-10-CM | POA: Diagnosis not present

## 2024-07-22 ENCOUNTER — Other Ambulatory Visit: Payer: Self-pay

## 2024-07-22 ENCOUNTER — Encounter: Payer: Self-pay | Admitting: Neurology

## 2024-07-22 ENCOUNTER — Telehealth: Payer: Self-pay

## 2024-07-22 DIAGNOSIS — R4189 Other symptoms and signs involving cognitive functions and awareness: Secondary | ICD-10-CM

## 2024-07-22 NOTE — Telephone Encounter (Signed)
 Called pt and made him and his wife aware of Dr. Cyrilla results, transfer them to front desk to make an appointment.

## 2024-07-26 NOTE — Progress Notes (Signed)
 Remote PPM Transmission

## 2024-07-28 ENCOUNTER — Encounter: Admitting: Physician Assistant

## 2024-07-28 DIAGNOSIS — L97822 Non-pressure chronic ulcer of other part of left lower leg with fat layer exposed: Secondary | ICD-10-CM | POA: Diagnosis not present

## 2024-07-28 DIAGNOSIS — S81812A Laceration without foreign body, left lower leg, initial encounter: Secondary | ICD-10-CM | POA: Diagnosis not present

## 2024-07-28 DIAGNOSIS — I480 Paroxysmal atrial fibrillation: Secondary | ICD-10-CM | POA: Diagnosis not present

## 2024-07-28 DIAGNOSIS — I10 Essential (primary) hypertension: Secondary | ICD-10-CM | POA: Diagnosis not present

## 2024-07-28 DIAGNOSIS — I87332 Chronic venous hypertension (idiopathic) with ulcer and inflammation of left lower extremity: Secondary | ICD-10-CM | POA: Diagnosis not present

## 2024-07-28 DIAGNOSIS — Z950 Presence of cardiac pacemaker: Secondary | ICD-10-CM | POA: Diagnosis not present

## 2024-07-28 DIAGNOSIS — Z7901 Long term (current) use of anticoagulants: Secondary | ICD-10-CM | POA: Diagnosis not present

## 2024-07-29 DIAGNOSIS — H401131 Primary open-angle glaucoma, bilateral, mild stage: Secondary | ICD-10-CM | POA: Diagnosis not present

## 2024-08-11 ENCOUNTER — Encounter: Admitting: Physician Assistant

## 2024-08-11 DIAGNOSIS — L97822 Non-pressure chronic ulcer of other part of left lower leg with fat layer exposed: Secondary | ICD-10-CM | POA: Diagnosis not present

## 2024-08-11 DIAGNOSIS — I87332 Chronic venous hypertension (idiopathic) with ulcer and inflammation of left lower extremity: Secondary | ICD-10-CM | POA: Diagnosis not present

## 2024-08-11 DIAGNOSIS — Z7901 Long term (current) use of anticoagulants: Secondary | ICD-10-CM | POA: Diagnosis not present

## 2024-08-11 DIAGNOSIS — I480 Paroxysmal atrial fibrillation: Secondary | ICD-10-CM | POA: Diagnosis not present

## 2024-08-11 DIAGNOSIS — Z950 Presence of cardiac pacemaker: Secondary | ICD-10-CM | POA: Diagnosis not present

## 2024-08-11 DIAGNOSIS — I10 Essential (primary) hypertension: Secondary | ICD-10-CM | POA: Diagnosis not present

## 2024-08-11 DIAGNOSIS — S81812A Laceration without foreign body, left lower leg, initial encounter: Secondary | ICD-10-CM | POA: Diagnosis not present

## 2024-08-12 DIAGNOSIS — M47816 Spondylosis without myelopathy or radiculopathy, lumbar region: Secondary | ICD-10-CM | POA: Diagnosis not present

## 2024-08-12 DIAGNOSIS — N400 Enlarged prostate without lower urinary tract symptoms: Secondary | ICD-10-CM | POA: Diagnosis not present

## 2024-08-12 DIAGNOSIS — I10 Essential (primary) hypertension: Secondary | ICD-10-CM | POA: Diagnosis not present

## 2024-08-12 DIAGNOSIS — N1831 Chronic kidney disease, stage 3a: Secondary | ICD-10-CM | POA: Diagnosis not present

## 2024-08-12 DIAGNOSIS — Z961 Presence of intraocular lens: Secondary | ICD-10-CM | POA: Diagnosis not present

## 2024-08-13 ENCOUNTER — Encounter: Payer: Self-pay | Admitting: Cardiology

## 2024-08-18 ENCOUNTER — Other Ambulatory Visit: Payer: Self-pay

## 2024-08-18 ENCOUNTER — Emergency Department (HOSPITAL_BASED_OUTPATIENT_CLINIC_OR_DEPARTMENT_OTHER)
Admission: EM | Admit: 2024-08-18 | Discharge: 2024-08-18 | Disposition: A | Discharge status: Home / Self Care | Attending: Emergency Medicine | Admitting: Emergency Medicine

## 2024-08-18 ENCOUNTER — Encounter: Attending: Physician Assistant | Admitting: Physician Assistant

## 2024-08-18 ENCOUNTER — Emergency Department (HOSPITAL_BASED_OUTPATIENT_CLINIC_OR_DEPARTMENT_OTHER)

## 2024-08-18 ENCOUNTER — Encounter (HOSPITAL_BASED_OUTPATIENT_CLINIC_OR_DEPARTMENT_OTHER): Payer: Self-pay | Admitting: Emergency Medicine

## 2024-08-18 DIAGNOSIS — Z950 Presence of cardiac pacemaker: Secondary | ICD-10-CM | POA: Diagnosis not present

## 2024-08-18 DIAGNOSIS — N410 Acute prostatitis: Secondary | ICD-10-CM | POA: Diagnosis not present

## 2024-08-18 DIAGNOSIS — I10 Essential (primary) hypertension: Secondary | ICD-10-CM | POA: Diagnosis not present

## 2024-08-18 DIAGNOSIS — I87302 Chronic venous hypertension (idiopathic) without complications of left lower extremity: Secondary | ICD-10-CM | POA: Insufficient documentation

## 2024-08-18 DIAGNOSIS — Z7901 Long term (current) use of anticoagulants: Secondary | ICD-10-CM | POA: Insufficient documentation

## 2024-08-18 DIAGNOSIS — S81812D Laceration without foreign body, left lower leg, subsequent encounter: Secondary | ICD-10-CM | POA: Diagnosis not present

## 2024-08-18 DIAGNOSIS — R300 Dysuria: Secondary | ICD-10-CM | POA: Diagnosis present

## 2024-08-18 DIAGNOSIS — R109 Unspecified abdominal pain: Secondary | ICD-10-CM | POA: Diagnosis not present

## 2024-08-18 DIAGNOSIS — I480 Paroxysmal atrial fibrillation: Secondary | ICD-10-CM | POA: Insufficient documentation

## 2024-08-18 DIAGNOSIS — L97822 Non-pressure chronic ulcer of other part of left lower leg with fat layer exposed: Secondary | ICD-10-CM | POA: Diagnosis not present

## 2024-08-18 DIAGNOSIS — Z09 Encounter for follow-up examination after completed treatment for conditions other than malignant neoplasm: Secondary | ICD-10-CM | POA: Insufficient documentation

## 2024-08-18 DIAGNOSIS — N400 Enlarged prostate without lower urinary tract symptoms: Secondary | ICD-10-CM | POA: Diagnosis not present

## 2024-08-18 DIAGNOSIS — I700 Atherosclerosis of aorta: Secondary | ICD-10-CM | POA: Insufficient documentation

## 2024-08-18 DIAGNOSIS — N200 Calculus of kidney: Secondary | ICD-10-CM | POA: Insufficient documentation

## 2024-08-18 DIAGNOSIS — N419 Inflammatory disease of prostate, unspecified: Secondary | ICD-10-CM | POA: Diagnosis not present

## 2024-08-18 DIAGNOSIS — Z872 Personal history of diseases of the skin and subcutaneous tissue: Secondary | ICD-10-CM | POA: Diagnosis not present

## 2024-08-18 DIAGNOSIS — S81812A Laceration without foreign body, left lower leg, initial encounter: Secondary | ICD-10-CM | POA: Diagnosis not present

## 2024-08-18 LAB — CBC WITH DIFFERENTIAL/PLATELET
Abs Immature Granulocytes: 0.04 K/uL (ref 0.00–0.07)
Basophils Absolute: 0 K/uL (ref 0.0–0.1)
Basophils Relative: 0 %
Eosinophils Absolute: 0.1 K/uL (ref 0.0–0.5)
Eosinophils Relative: 1 %
HCT: 38.1 % — ABNORMAL LOW (ref 39.0–52.0)
Hemoglobin: 13.2 g/dL (ref 13.0–17.0)
Immature Granulocytes: 0 %
Lymphocytes Relative: 22 %
Lymphs Abs: 2 K/uL (ref 0.7–4.0)
MCH: 30.1 pg (ref 26.0–34.0)
MCHC: 34.6 g/dL (ref 30.0–36.0)
MCV: 87 fL (ref 80.0–100.0)
Monocytes Absolute: 1 K/uL (ref 0.1–1.0)
Monocytes Relative: 10 %
Neutro Abs: 6.2 K/uL (ref 1.7–7.7)
Neutrophils Relative %: 67 %
Platelets: 160 K/uL (ref 150–400)
RBC: 4.38 MIL/uL (ref 4.22–5.81)
RDW: 12.9 % (ref 11.5–15.5)
WBC: 9.2 K/uL (ref 4.0–10.5)
nRBC: 0 % (ref 0.0–0.2)

## 2024-08-18 LAB — URINALYSIS, ROUTINE W REFLEX MICROSCOPIC
Bacteria, UA: NONE SEEN
Bilirubin Urine: NEGATIVE
Glucose, UA: NEGATIVE mg/dL
Ketones, ur: NEGATIVE mg/dL
Leukocytes,Ua: NEGATIVE
Nitrite: NEGATIVE
Protein, ur: NEGATIVE mg/dL
Specific Gravity, Urine: <1.005 — ABNORMAL LOW (ref 1.005–1.030)
pH: 6 (ref 5.0–8.0)

## 2024-08-18 LAB — BASIC METABOLIC PANEL WITH GFR
Anion gap: 14 (ref 5–15)
BUN: 10 mg/dL (ref 8–23)
CO2: 22 mmol/L (ref 22–32)
Calcium: 9.1 mg/dL (ref 8.9–10.3)
Chloride: 102 mmol/L (ref 98–111)
Creatinine, Ser: 1.28 mg/dL — ABNORMAL HIGH (ref 0.61–1.24)
GFR, Estimated: 58 mL/min — ABNORMAL LOW (ref >60)
Glucose, Bld: 100 mg/dL — ABNORMAL HIGH (ref 70–99)
Potassium: 3.5 mmol/L (ref 3.5–5.1)
Sodium: 138 mmol/L (ref 135–145)

## 2024-08-18 MED ORDER — SULFAMETHOXAZOLE-TRIMETHOPRIM 800-160 MG PO TABS
1.0000 | ORAL_TABLET | Freq: Once | ORAL | Status: AC
Start: 2024-08-18 — End: 2024-08-18
  Administered 2024-08-18: 1 via ORAL
  Filled 2024-08-18: qty 1

## 2024-08-18 MED ORDER — SULFAMETHOXAZOLE-TRIMETHOPRIM 800-160 MG PO TABS
1.0000 | ORAL_TABLET | Freq: Two times a day (BID) | ORAL | 0 refills | Status: AC
Start: 2024-08-18 — End: 2024-09-01

## 2024-08-18 NOTE — ED Triage Notes (Addendum)
 Difficulty urinating. Burning when going. All day. Feels he is emptying completely. Denies color change of urine, -N/-V/-D, -CP,-SOB. Endorses some fatigue.   Pacemaker. On eliquis - temporarily off for tooth to be pulled tomorrow.

## 2024-08-18 NOTE — ED Provider Notes (Signed)
 [place] EMERGENCY DEPARTMENT AT [facility] Provider Note   CSN: [id number] Arrival date & time: 08/18/24  1911     Patient presents with: Dysuria   Leonard Maldonado is a 73 y.o. male.   Patient is a 73 year old male who presents with dysuria.  He said he has had trouble getting his urine out.  He said to go frequently.  He says it hurts when he tries to urinate.  He denies any associate abdominal pain.  No fevers.  No nausea or vomiting.  No pain with bowel movements.  No history of similar symptoms in the past.       Prior to Admission medications   Medication Sig Start Date End Date Taking? Authorizing Provider  sulfamethoxazole-trimethoprim (BACTRIM DS) 800-160 MG tablet Take 1 tablet by mouth 2 (two) times daily for 14 days. 08/18/24 09/01/24 Yes Lenor Hollering, MD  acetaminophen  (TYLENOL ) 650 MG CR tablet Take 650 mg by mouth every 8 (eight) hours as needed for pain.    [provider]  apixaban  (ELIQUIS ) 5 MG TABS tablet Take 1 tablet (5 mg total) by mouth 2 (two) times daily. This is a sample 03/28/24   Lelon Hamilton T, PA-C  apixaban  (ELIQUIS ) 5 MG TABS tablet Take 1 tablet (5 mg total) by mouth 2 (two) times daily. This is a sample Patient not taking: Reported on 06/12/2024 03/28/24   Lelon Hamilton T, PA-C  apixaban  (ELIQUIS ) 5 MG TABS tablet Take 1 tablet (5 mg total) by mouth 2 (two) times daily. Patient not taking: Reported on 06/12/2024 04/02/24   Inocencio Soyla Lunger, MD  cholecalciferol (VITAMIN D) 1000 UNITS tablet Take 1,000 Units by mouth daily.    [provider]  COENZYME Q-10 PO Take 1 tablet by mouth daily.    [provider]  finasteride  (PROSCAR ) 5 MG tablet Take 5 mg by mouth every evening.  08/05/18   [provider]  Multiple Vitamin (MULTIVITAMIN) tablet Take 1 tablet by mouth daily.    [provider]  Multiple Vitamins-Minerals (EMERGEN-C VITAMIN C) PACK Take 1 Dose by mouth 2 (two) times a week. Patient  not taking: Reported on 06/12/2024    [provider]  Omega-3 Fatty Acids (FISH OIL) 1200 MG CAPS Take 1,200 mg by mouth daily.     [provider]  oxybutynin  (DITROPAN -XL) 10 MG 24 hr tablet Take 10 mg by mouth daily. 01/02/24   [provider]  pantoprazole  (PROTONIX ) 40 MG tablet Take 40 mg by mouth 2 (two) times daily. 08/21/22   [provider]  sildenafil (REVATIO) 20 MG tablet Take 20-40 mg by mouth as needed (for ED).  08/30/19   [provider]  simvastatin  (ZOCOR ) 40 MG tablet Take 1 tablet (40 mg total) by mouth at bedtime. 03/07/24   Krishnan, Gokul, MD  tamsulosin  (FLOMAX ) 0.4 MG CAPS capsule Take 0.4 mg by mouth 2 (two) times daily.  08/27/18   [provider]    Allergies: Atorvastatin and Hydrochlorothiazide    Review of Systems  Constitutional:  Negative for fatigue and fever.  Respiratory:  Negative for shortness of breath.   Gastrointestinal:  Negative for abdominal pain, nausea and vomiting.  Genitourinary:  Positive for dysuria, frequency and urgency. Negative for flank pain, hematuria, penile swelling, scrotal swelling and testicular pain.  Neurological:  Negative for dizziness and headaches.    Updated Vital Signs BP (!) 150/95   Pulse 65   Temp 98 F (36.7 C) (Oral)  Resp 16   SpO2 100%   Physical Exam Constitutional:      Appearance: He is well-developed.  HENT:     Head: Normocephalic and atraumatic.  Eyes:     Pupils: Pupils are equal, round, and reactive to light.  Cardiovascular:     Rate and Rhythm: Normal rate and regular rhythm.     Heart sounds: Normal heart sounds.  Pulmonary:     Effort: Pulmonary effort is normal. No respiratory distress.     Breath sounds: Normal breath sounds. No wheezing or rales.  Chest:     Chest wall: No tenderness.  Abdominal:     General: Bowel sounds are normal.     Palpations: Abdomen is soft.     Tenderness: There is no abdominal tenderness. There is no  guarding or rebound.  Genitourinary:    Comments: Normal external male genitalia, no testicular tenderness, no penile discharge, no inguinal hernias noted Musculoskeletal:        General: Normal range of motion.     Cervical back: Normal range of motion and neck supple.  Lymphadenopathy:     Cervical: No cervical adenopathy.  Skin:    General: Skin is warm and dry.     Findings: No rash.  Neurological:     Mental Status: He is alert and oriented to person, place, and time.     (all labs ordered are listed, but only abnormal results are displayed) Labs Reviewed  URINALYSIS, ROUTINE W REFLEX MICROSCOPIC - Abnormal; Notable for the following components:      Result Value   Color, Urine COLORLESS (*)    Specific Gravity, Urine <1.005 (*)    Hgb urine dipstick TRACE (*)    All other components within normal limits  BASIC METABOLIC PANEL WITH GFR - Abnormal; Notable for the following components:   Glucose, Bld 100 (*)    Creatinine, Ser 1.28 (*)    GFR, Estimated 58 (*)    All other components within normal limits  CBC WITH DIFFERENTIAL/PLATELET - Abnormal; Notable for the following components:   HCT 38.1 (*)    All other components within normal limits    EKG: None  Radiology: CT Renal Stone Study Result Date: 08/18/2024 CLINICAL DATA:  Abdominal and flank pain with stone suspected. Difficulty urinating. Burning with urination. EXAM: CT ABDOMEN AND PELVIS WITHOUT CONTRAST TECHNIQUE: Multidetector CT imaging of the abdomen and pelvis was performed following the standard protocol without IV contrast. RADIATION DOSE REDUCTION: This exam was performed according to the departmental dose-optimization program which includes automated exposure control, adjustment of the mA and/or kV according to patient size and/or use of iterative reconstruction technique. COMPARISON:  Abdominal radiographs 03/02/2022. CT abdomen and pelvis 10/18/2018. FINDINGS: Lower chest: Lung bases are clear.  Hepatobiliary: No focal liver abnormality is seen. No gallstones, gallbladder wall thickening, or biliary dilatation. Pancreas: Unremarkable. No pancreatic ductal dilatation or surrounding inflammatory changes. Spleen: Normal in size without focal abnormality. Adrenals/Urinary Tract: No adrenal gland nodules. Multiple bilateral intrarenal stones. Largest measure up to 4 mm diameter. No hydronephrosis or hydroureter. No ureteral stones are demonstrated. Bladder is normal. No bladder wall thickening or filling defects. Stomach/Bowel: Stomach, small bowel, and colon are mostly decompressed with scattered stool in the colon. No wall thickening or inflammatory stranding. Appendix is normal. Vascular/Lymphatic: Aortic atherosclerosis. No enlarged abdominal or pelvic lymph nodes. Reproductive: Prostate gland is enlarged. Other: No abdominal wall hernia or abnormality. No abdominopelvic ascites. Musculoskeletal: Degenerative changes in the spine. No acute bony  abnormalities. IMPRESSION: 1. Multiple bilateral nonobstructing intrarenal stones. 2. No ureteral stone or obstruction. 3. Bladder is unremarkable. 4. Prostate gland is enlarged. 5. Aortic atherosclerosis. Electronically Signed   By: Elsie Gravely M.D.   On: 08/18/2024 21:20     Procedures   Medications Ordered in the ED  sulfamethoxazole-trimethoprim (BACTRIM DS) 800-160 MG per tablet 1 tablet (1 tablet Oral Given 08/18/24 2241)                                    Medical Decision Making Amount and/or Complexity of Data Reviewed Labs: ordered. Radiology: ordered.  Risk Prescription drug management.   This patient presents to the ED for concern of dysuria, this involves an extensive number of treatment options, and is a complaint that carries with it a high risk of complications and morbidity.  I considered the following differential and admission for this acute, potentially life threatening condition.  The differential diagnosis includes UTI,  kidney stone, urinary retention, prostatitis  MDM:    Patient is a 73 year old who presents with pain on urination as well as urinary frequency and urgency.  He also reports some sense that he has to have a bowel movement when he is urinating.  He denies any penile discharge.  No significant abdominal pain.  His urine does not look consistent with infection.  CT abdomen pelvis do not show any kidney stones.  No other acute abnormality.  There is some prostate enlargement.  Bladder scan does not show any indication of urinary retention.  There is only 43 cc of urine.  Suspect he has prostatitis.  Will treat him with antibiotics.  Will start him on Bactrim.  Encouraged him to have close follow-up with his urologist.  Return precautions were given.  (Labs, imaging, consults)  Labs: I Ordered, and personally interpreted labs.  The pertinent results include: Mildly elevated creatinine but similar to prior values, urine not consistent with infection  Imaging Studies ordered: I ordered imaging studies including CT abdomen pelvis I independently visualized and interpreted imaging. I agree with the radiologist interpretation  Additional history obtained from wife.  External records from outside source obtained and reviewed including history  Cardiac Monitoring: The patient was not maintained on a cardiac monitor.  If on the cardiac monitor, I personally viewed and interpreted the cardiac monitored which showed an underlying rhythm of:    Reevaluation: After the interventions noted above, I reevaluated the patient and found that they have :improved  Social Determinants of Health:    Disposition: Discharged to home  Co morbidities that complicate the patient evaluation  Past Medical History:  Diagnosis Date   HTN (hypertension)    Hyperlipemia    Kidney calculus    Second degree AV block      Medicines Meds ordered this encounter  Medications   sulfamethoxazole-trimethoprim (BACTRIM  DS) 800-160 MG per tablet 1 tablet   sulfamethoxazole-trimethoprim (BACTRIM DS) 800-160 MG tablet    Sig: Take 1 tablet by mouth 2 (two) times daily for 14 days.    Dispense:  28 tablet    Refill:  0    I have reviewed the patients home medicines and have made adjustments as needed  Problem List / ED Course: Problem List Items Addressed This Visit   None Visit Diagnoses       Acute prostatitis    -  Primary  Final diagnoses:  Acute prostatitis    ED Discharge Orders          Ordered    sulfamethoxazole-trimethoprim (BACTRIM DS) 800-160 MG tablet  2 times daily        08/18/24 2235               Lenor Hollering, MD 08/19/24 0000

## 2024-08-18 NOTE — ED Notes (Signed)
 Questions and concerns addressed. Discharge teaching completed.   Prescriptions reviewed and pharmacy verified.   Pt ambulatory upon discharge.

## 2024-08-18 NOTE — ED Notes (Signed)
 Pt needed to emergently urinate, felt burning and pressure. Escorted to restroom, no blood noted in urine, unsure of amount, but felt much better afterwards.

## 2024-08-18 NOTE — Discharge Instructions (Signed)
 Take the antibiotics as discussed.  Have close follow-up with your urologist.  Return to the emergency room if you have any worsening symptoms.

## 2024-08-19 DIAGNOSIS — N529 Male erectile dysfunction, unspecified: Secondary | ICD-10-CM | POA: Diagnosis not present

## 2024-08-19 DIAGNOSIS — M1990 Unspecified osteoarthritis, unspecified site: Secondary | ICD-10-CM | POA: Diagnosis not present

## 2024-08-19 DIAGNOSIS — N1831 Chronic kidney disease, stage 3a: Secondary | ICD-10-CM | POA: Diagnosis not present

## 2024-08-19 DIAGNOSIS — L97901 Non-pressure chronic ulcer of unspecified part of unspecified lower leg limited to breakdown of skin: Secondary | ICD-10-CM | POA: Diagnosis not present

## 2024-08-19 DIAGNOSIS — I4891 Unspecified atrial fibrillation: Secondary | ICD-10-CM | POA: Diagnosis not present

## 2024-08-19 DIAGNOSIS — E785 Hyperlipidemia, unspecified: Secondary | ICD-10-CM | POA: Diagnosis not present

## 2024-08-19 DIAGNOSIS — K219 Gastro-esophageal reflux disease without esophagitis: Secondary | ICD-10-CM | POA: Diagnosis not present

## 2024-08-19 DIAGNOSIS — D6869 Other thrombophilia: Secondary | ICD-10-CM | POA: Diagnosis not present

## 2024-08-19 DIAGNOSIS — I700 Atherosclerosis of aorta: Secondary | ICD-10-CM | POA: Diagnosis not present

## 2024-08-21 DIAGNOSIS — R300 Dysuria: Secondary | ICD-10-CM | POA: Diagnosis not present

## 2024-08-21 DIAGNOSIS — R197 Diarrhea, unspecified: Secondary | ICD-10-CM | POA: Diagnosis not present

## 2024-08-21 DIAGNOSIS — R350 Frequency of micturition: Secondary | ICD-10-CM | POA: Diagnosis not present

## 2024-08-21 DIAGNOSIS — N400 Enlarged prostate without lower urinary tract symptoms: Secondary | ICD-10-CM | POA: Diagnosis not present

## 2024-08-25 DIAGNOSIS — R399 Unspecified symptoms and signs involving the genitourinary system: Secondary | ICD-10-CM | POA: Diagnosis not present

## 2024-08-25 DIAGNOSIS — N410 Acute prostatitis: Secondary | ICD-10-CM | POA: Diagnosis not present

## 2024-09-01 ENCOUNTER — Ambulatory Visit: Admitting: Physician Assistant

## 2024-09-04 ENCOUNTER — Ambulatory Visit: Admitting: Psychology

## 2024-09-04 ENCOUNTER — Ambulatory Visit: Payer: Self-pay

## 2024-09-04 DIAGNOSIS — G3184 Mild cognitive impairment, so stated: Secondary | ICD-10-CM | POA: Diagnosis not present

## 2024-09-04 DIAGNOSIS — R4189 Other symptoms and signs involving cognitive functions and awareness: Secondary | ICD-10-CM

## 2024-09-04 NOTE — Progress Notes (Unsigned)
 NEUROPSYCHOLOGICAL EVALUATION [place]  [place] Department of Neurology  Date of Evaluation: 09/04/2024  REASON FOR REFERRAL   Leonard Maldonado is a 73 year old, right-handed, White male with 12 years of formal education. He was referred for neuropsychological evaluation by his neurologist, Venetia Potters, M.D., to assess current neurocognitive functioning, document potential cognitive deficits, and assist with treatment planning. This is his first neuropsychological evaluation.  SUMMARY OF RESULTS   Premorbid cognitive abilities are estimated to be in the low average range based on word reading and sociodemographic factors. Relative to this baseline estimate, current performance was below expectations across aspects of learning/memory, language, and attention/working memory.  Specifically, he demonstrated markedly poor delayed recall across all memory tasks--including a word list, short stories, a complex figure, and shapes--with retention at 0% for each measure. While this may partly reflect limited learning of the material, there is also concern for potential memory storage difficulties. For example, on a task where he had the advantage of carefully copying a complex figure at his own pace, he was still unable to recall a single detail after only just three minutes. Recognition memory was variable, with adequate performance for short stories and shapes but poor performance for the word list.  Additionally, he demonstrated difficulty on tasks of verbal fluency, while confrontation naming was intact. Performance on a visual working memory task was poor, while performance on auditory working memory tasks was intact.   Remaining measures of processing speed, executive functioning, and visuospatial abilities were within age-related expectations.  On self-report questionnaires, he did not endorse significant levels of depression or anxiety.   DIAGNOSTIC IMPRESSION   Results  of the current evaluation indicated deficits primarily in learning/memory and verbal fluency. In the setting of largely preserved functional independence, findings support a diagnosis of mild cognitive impairment. The underlying etiology is not entirely clear at this time.   Neuroimaging was notable for ventriculomegaly, which was described as questionably disproportionate with the degree of cerebral atrophy. At this time, the patient does not convincingly exhibit the classic triad of symptoms associated with normal pressure hydrocephalus (NPH); specifically, he demonstrates no gait disturbance, and although he reports urinary incontinence, his medical history includes benign prostatic hyperplasia and chronic kidney disease, which could account for this symptom. While the characteristic neuropsychological profile associated with NPH--often including significant executive dysfunction, psychomotor slowing, and apathy--is not unique to the condition, this pattern is not clearly evident in this patient.   The degree of cerebral atrophy and memory impairment (particularly 0% retention) raises concern for a possible underlying neurodegenerative disorder, such as Alzheimer's disease; however, his cognitive profile does not align entirely with a typical Alzheimer's pattern either. Notably, memory storage at times appears relatively preserved compared with encoding and retrieval, complicating the distinction between frontal-subcortical and mesial-temporal involvement.  Neuroimaging does not indicate significant white matter disease that could account for his memory deficits. He is taking oxybutynin , which has known anticholinergic properties; however, a single medication, particularly at a standard dose, is unlikely to be the main cause of the observed memory impairment. Finally, no significant psychological/emotional, sleep-related, physical, or lifestyle factors were identified that would suggest alternative  etiologies for cognitive decline.  Further neurological workup--such as blood-based biomarkers or cerebrospinal fluid analysis--may provide additional diagnostic clarification. Serial assessment will also be helpful to track progression and further clarify the underlying etiology.   ICD-10 Codes: G31.84 Mild cognitive impairment with memory loss  RECOMMENDATIONS   A repeat neuropsychological evaluation in 12-18 months (or sooner  if functional decline is noted) is recommended.  Discuss with your neurologist the risks and benefits of starting a medication that can help slow memory decline.  Performance across neurocognitive testing is not a strong predictor of an individual's safety operating a motor vehicle. Should his family wish to pursue a formalized driving evaluation, they could reach out to the following agencies:  [employer] in [place]: [phone] [facility] in [place]: [phone] [facility] in [place]: [phone] [facility] in [place]: [phone] or [phone]  Prioritize physical health through diet, exercise, and sleep. Regular physical activity supports cardiovascular health, improves mood, and helps preserve mobility and independence. Aim for at least 150 minutes of moderate aerobic exercise per week (e.g., brisk walking, swimming, gardening). A brain-healthy diet such as the Mediterranean or MIND diet is rich in fruits, vegetables, whole grains, healthy fats, and lean proteins, and has been associated with reduced risk of cognitive decline. Additionally, getting adequate, quality sleep and managing chronic conditions with the help of healthcare providers are essential components of healthy aging.  Continue to stay socially and mentally engaged. Maintaining strong social connections and regularly stimulating your brain can help protect against cognitive decline. This includes staying connected with  friends and family, volunteering, or participating in community groups. Mentally engaging activities--such as reading, doing puzzles, playing strategy games, or learning a new language or musical instrument--promote brain plasticity. If you are interested in activities to support cognitive engagement, this site offers a variety of apps and games organized by difficulty level:  https://www.[facility]row[facility].org/get-to-know-barrow/centers-programs/neurorehabilitation-center/neuro-rehab-apps-and-games/  Consider implementing compensatory strategies to maximize independence and maintain daily functioning. Examples include:  Adhere to routine. Compensatory strategies work best when they are used consistently. Use a planner, calendar, or white board that has the schedule and important events for the day clearly listed to reference and cross off when tasks are complete.  Ask for written information, especially if it is new or unfamiliar (e.g., information provided at a doctor's appointment).  Create an organized environment. Keep items that can be easily misplaced in a sensible location and get into the habit of always returning the items to those places. Pay attention and reduce distractions. Make a point of focusing attention on information you want to remember. One-on-one interaction is more likely to facilitate attention and minimize distraction. Make eye contact and repeat the information out loud after you hear it. Reduce interruptions or distractions especially when attempting to learn new information.  Create associations. When learning something new, think about and understand the information. Explain it in your own words or try to associate it with something you already know. Take notes to help remember important details. Evaluate goals and plan accordingly. When confronted by many different tasks, begin by making a list that prioritizes each task and estimates the time it will take to complete. Break down  complicated tasks into smaller, more manageable steps. Focus on one task at a time and complete each task before starting another. Avoid multitasking.  DISPOSITION   Patient will follow up with the referring provider, Dr. Leigh. He should return for repeat neuropsychological testing in 12-18 months to monitor his course and assist with diagnosis and treatment planning. He and his wife will be provided verbal feedback in approximately one week regarding the findings and impression during this visit.  The remainder of the report includes the details of the patient's background and a table of results from the current evaluation, which support the summary and recommendations described above.  BACKGROUND  History of Presenting Illness: The following information was obtained from a review of medical records and an interview with the patient and his wife, Cecelia. Briefly, the patient was evaluated by Dr. Leigh at [facility] on 05/15/2024 for transient dizziness; please refer to the medical record for additional details regarding this issue. Additionally, symptoms of normal pressure hydrocephalus were reviewed, as MRI from April 2025 showed ventriculomegaly possibly disproportionate to the degree of cerebral atrophy. At the time, the patient denied urinary symptoms and did not demonstrate any gait disturbance, including no evidence of magnetic gait. However, concerns were raised about mild cognitive changes, such as occasional confusion with directions while driving and unawareness of scheduled activities on his calendar. MoCA = 21/30. He was referred for a neuropsychological assessment for further evaluation.  Cognitive Functioning: During today's appointment, the patient reported experiencing mild cognitive changes over the past year, while his wife felt these changes may have occurred even more recently (i.e., within the past six months). Patient described occasional difficulty recalling conversations  or locating misplaced items, mild word-finding difficulties, slightly slower processing speed, sometimes forgetting why he entered a room, and reduced efficiency with planning and organization. He denied significant concerns in these areas, as well as issues with attention or navigation. His wife generally agreed with his report. She does not consistently observe short-term memory issues. She expressed mild, proactive concern about his navigation while driving but noted that he has not exhibited any behaviors that clearly warrant concern.  Physical Functioning: Patient denied balance problems, falls, and tremors. He feels his gait is steady, though possibly somewhat slower. He reported urinary incontinence that has been present for more than a year, which differs from what was previously reported to neurology. He wonders if this is due to kidney issues. His history is also notable for benign prostatic hyperplasia. He denied difficulties with both sleep initiation and maintenance, stating he wakes up feeling rested. Appetite is stable, with no reported changes to sense of taste or smell. Vision remains stable following cataract surgery; he also has a history of glaucoma. 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  Emotional Functioning: Patient described his recent mood as good. He denied suicidal ideation. His wife did not raise any concerns in this regard.  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  Other Relevant Medical History: Remarkable for paroxysmal atrial fibrillation, hypertension, hyperlipidemia, chronic kidney disease stage III, benign prostatic hyperplasia, lumbar spine disease, gastroesophageal reflux disease, and diverticulitis. Please refer to the medical record for a more comprehensive  problem list. No history of stroke, CNS infection, serious head injury, or seizure was reported.  Current Medications: Per record, acetaminophen , apixaban , coenzyme Q10, finasteride , multivitamin, omega-3 fatty acids, oxybutynin , pantoprazole , sildenafil, simvastatin , tamsulosin , and vitamin D.   Functional Status: Patient continues to drive without reported accidents, traffic violations, or navigational difficulties. He generally manages his own medications without issues. However, he has recently experienced some prostate-related problems, which have required new medications; his wife has been assisting with the management of these new prescriptions. She has also been managing the household finances for several years, not due to any errors on the patient's part but because he was still working when she retired. Patient denied difficulties using household appliances or tools. He remains independent with all basic activities of daily living.  Family Neurological History: Remarkable for Alzheimer's disease  in the patient's mother.  Psychiatric History: History of depression, anxiety, prior mental health treatment, suicidal ideation, hallucinations, and psychiatric hospitalizations was not reported.  Substance Use History: Patient reported infrequent alcohol consumption. He quit smoking cigarettes approximately 50 years ago. Current use of marijuana and other illicit substances was denied.   Social and Developmental History: Patient was born in [place], [place]. History of perinatal complications and developmental delays was not reported. They have one son and previously had a daughter who is now deceased.  Educational and Occupational History: No history of childhood learning disability, special education services, or grade retention was reported. Patient described his grades as just passing  but emphasized that he never failed a grade. He frequently skipped class. However, he graduated high school on  time. He subsequently completed two years at a [employer] college, where he earned a certification in plumbing. 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. He retired approximately 20 years ago.  BEHAVIORAL OBSERVATIONS   Patient arrived on time and was accompanied by his wife, Cecelia. He ambulated independently and without gait disturbance. He was alert and fully oriented. He was appropriately groomed and dressed for the setting. No significant sensory or motor abnormalities were observed. Vision and hearing (with bilateral hearing aids) were adequate for testing purposes. Speech was of normal rate, prosody, and volume. No conversational word-finding difficulties, paraphasic errors, or dysarthria were observed. Comprehension was conversationally intact. Thought processes were linear, logical, and coherent. Thought content was organized and devoid of delusions. Insight appeared appropriate. Affect was even and congruent with mood. He was cooperative and appeared to give adequate effort during testing, including on standalone and embedded measures of performance validity. There was only a single exception of a below-cutoff score on a standalone measure, but this seemed to reflect an inefficient task strategy rather than reduced engagement. Results are thought to accurately reflect his cognitive functioning at this time.  NEUROPSYCHOLOGICAL TESTING RESULTS   Tests Administered: Animal Naming Test; Brief Visuospatial Memory Test-Revised (BVMT-R) - Form 1; Controlled Oral Word Association Test (COWAT): FAS; Geriatric Anxiety Scale-10 Item (GAS-10); Geriatric Depression Scale Short Form (GDS-SF); Hopkins Verbal Learning Test-Revised (HVLT-R) - From 1; Judgment of Line Orientation (JLO) - Form V; Neuropsychological Assessment Battery (NAB) - Subtest(s): Naming Form 1; Rey Complex Figure Test (RCFT) - Copy only; Standalone  performance validity tests (PVTs); Test of Premorbid Functioning (TOPF); Trail Making Test (TMT); Wechsler Adult Intelligence Scale Fifth Edition (WAIS-5) - Subtest(s): Similarities, Clinical cytogeneticist, Digit Sequencing, Coding, Running Digits, Symbol Search, Symbol Span; Wechsler Memory Scale Fourth Edition (WMS-IV) - Subtest(s): Logical Memory (LM); and [place]  Card Sorting Test 64 Card Version (WCST-64).  Test results are provided in the table below. Whenever possible, the patient's scores were compared against age-, sex-, and education-corrected normative samples. Interpretive descriptions are based on the AACN consensus conference statement on uniform labeling (Guilmette et al., 2020).  PREMORBID FUNCTIONING RAW  RANGE  TOPF 23 StdS=85 Low Average  ATTENTION & WORKING MEMORY RAW  RANGE  WAIS-5 Digit Sequencing -- ss=9 Average  WAIS-5 Running Digits -- ss=7 Low Average  WAIS-5 Symbol Span -- ss=3 Exceptionally Low  PROCESSING SPEED RAW  RANGE  Trails A 42''0e T=48 Average  WAIS-5 Coding  -- ss=6 Low Average  WAIS-5 Symbol Search -- ss=6 Low Average  EXECUTIVE FUNCTION RAW  RANGE  Trails B 166''0e T=40 Low Average  WAIS-5 Similarities -- ss=6 Low Average  COWAT Letter Fluency 7+6+8 T=35 Below Average  WCST-64 Total Errors 18 T=53 Average  WCST-64 Perseverative Errors 10 T=52 Average  WCST-64 Nonperseverative Errors 8 T=47 Average  WCST-64 Categories Completed 1 11-16%ile Low Average  WCSR-64 FMS 3 -- --  LANGUAGE RAW  RANGE  COWAT Letter Fluency 7+6+8 T=35 Below Average  Animal Naming Test 8 T=27 Exceptionally Low  NAB Naming Test 31/31 T=60 WNL  VISUOSPATIAL RAW  RANGE  WAIS-5 Block Design -- ss=11 Average  JLO C/S=26/30 56%ile Average  BVMT-R Copy Trial 12/12 -- WNL  VERBAL LEARNING & MEMORY RAW  RANGE  HVLT-R Learning Trials (3+4+5)/36 T=29 Exceptionally Low  HVLT-R Delayed Recall 0/12 T=19 Exceptionally Low  HVLT-R Recognition Hits 8 -- --  HVLT-R Recognition False Positives 2  -- --  HVLT-R Discrimination Index 6 T=26 Exceptionally Low  WMS-IV LM-I  (2+2+3)/53 ss=2 Exceptionally Low  WMS-IV LM-II  (0+0)/39 ss=1 Exceptionally Low  WMS-IV LM Recognition  (5+13)/23 26-50%ile Average  VISUAL LEARNING & MEMORY RAW  RANGE  RCFT Copy 29.5/36 >16%ile WNL  RCFT Immediate 0/36 T=20 Exceptionally Low  BVMT-R Total Recall (2+3+3)/36 T=29 Exceptionally Low  BVMT-R Delayed Recall 0/12 T=20 Exceptionally Low  BVMT-R Percent Retained 0 <1%ile Exceptionally Low  BVMT-R Recognition Hits 5 >16%ile WNL  BVMT-R Recognition False Alarms 1 11-16%ile Low Average  BVMT-R Recognition Discrimination Index 4 11-16%ile Low Average  QUESTIONNAIRES RAW  RANGE  GDS-SF 1 -- Minimal  GAS-10 4 -- Minimal  *Note: ss = scaled score; StdS = standard score; T = t-score; C/S = corrected raw score; WNL = within normal limits; BNL= below normal limits; D/C = discontinued. Scores from skewed distributions are typically interpreted as WNL (>=16th %ile) or BNL (<16th %ile).   INFORMED CONSENT   Patient was provided with a verbal description of the nature and purpose of the neuropsychological evaluation. Also reviewed were the foreseeable risks and/or discomforts and benefits of the procedure, limits of confidentiality, and mandatory reporting requirements of this provider. Patient was given the opportunity to have their questions answered. Oral consent to participate was provided by the patient.   This report was prepared as part of a clinical evaluation and is not intended for forensic use.  SERVICE   This evaluation was conducted by Renda Beckwith, Psy.D. In addition to time spent directly with the patient, total professional time (180 minutes) includes record review, integration of relevant medical history, test selection, interpretation of findings, and report preparation. A technician, Evalene Pizza, B.S., provided testing and scoring assistance (174 minutes).  Psychiatric Diagnostic Evaluation  Services (Professional): 09208 x 1 Neuropsychological Testing Evaluation Services (Professional): [id number] x 1 Neuropsychological Testing Evaluation Services (Professional): 03866 x 2 Neuropsychological Test Administration and Scoring (Technician): [phone] x 1 Neuropsychological Test Administration and Scoring (Technician): [phone] x 5  This report was generated using voice recognition software. While this document has been carefully reviewed, transcription errors may be present. I apologize in advance for any inconvenience. Please contact me if further clarification is needed.            Renda Beckwith, Psy.D.             Neuropsychologist

## 2024-09-04 NOTE — Progress Notes (Signed)
   Psychometrician Note   Cognitive testing was administered to Leonard Maldonado by Evalene Pizza, B.S. (psychometrist) under the supervision of Dr. Renda Beckwith, Psy.D., licensed psychologist on 09/04/2024. Leonard Maldonado did not appear overtly distressed by the testing session per behavioral observation or responses across self-report questionnaires. Rest breaks were offered.   The battery of tests administered was selected by Dr. Renda Beckwith, Psy.D. with consideration to Leonard Maldonado current level of functioning, the nature of his symptoms, emotional and behavioral responses during interview, level of literacy, observed level of motivation/effort, and the nature of the referral question. This battery was communicated to the psychometrist. Communication between Dr. Renda Beckwith, Psy.D. and the psychometrist was ongoing throughout the evaluation and Dr. Renda Beckwith, Psy.D. was immediately accessible at all times. Dr. Renda Beckwith, Psy.D. provided supervision to the psychometrist on the date of this service to the extent necessary to assure the quality of all services provided.    Leonard Maldonado will return within approximately 1-2 weeks for an interactive feedback session with Dr. Beckwith at which time his test performances, clinical impressions, and treatment recommendations will be reviewed in detail. Leonard Maldonado understands he can contact our office should he require our assistance before this time.  A total of 174 minutes of billable time were spent face-to-face with Leonard Maldonado by the psychometrist. This includes both test administration and scoring time. Billing for these services is reflected in the clinical report generated by Dr. Renda Beckwith, Psy.D.  This note reflects time spent with the psychometrician and does not include test scores or any clinical interpretations made by Dr. Beckwith. The full report will follow in a separate note.

## 2024-09-11 DIAGNOSIS — N401 Enlarged prostate with lower urinary tract symptoms: Secondary | ICD-10-CM | POA: Diagnosis not present

## 2024-09-11 DIAGNOSIS — R3915 Urgency of urination: Secondary | ICD-10-CM | POA: Diagnosis not present

## 2024-09-12 DIAGNOSIS — I10 Essential (primary) hypertension: Secondary | ICD-10-CM | POA: Diagnosis not present

## 2024-09-12 DIAGNOSIS — N400 Enlarged prostate without lower urinary tract symptoms: Secondary | ICD-10-CM | POA: Diagnosis not present

## 2024-09-12 DIAGNOSIS — N1831 Chronic kidney disease, stage 3a: Secondary | ICD-10-CM | POA: Diagnosis not present

## 2024-09-12 DIAGNOSIS — M47816 Spondylosis without myelopathy or radiculopathy, lumbar region: Secondary | ICD-10-CM | POA: Diagnosis not present

## 2024-09-19 ENCOUNTER — Ambulatory Visit: Admitting: Psychology

## 2024-09-19 DIAGNOSIS — G3184 Mild cognitive impairment, so stated: Secondary | ICD-10-CM

## 2024-09-19 NOTE — Progress Notes (Signed)
   NEUROPSYCHOLOGY FEEDBACK SESSION [facility]  [place] Department of Neurology  Date of Feedback Session: 09/19/2024  REASON FOR REFERRAL   Leonard Maldonado is a 73 year old, right-handed, White male with 12 years of formal education. He was referred for neuropsychological evaluation by his neurologist, Venetia Potters, M.D., to assess current neurocognitive functioning, document potential cognitive deficits, and assist with treatment planning. This is his first neuropsychological evaluation.  FEEDBACK   Patient completed a comprehensive neuropsychological evaluation on 09/04/2024. Please refer to that encounter for the full report and recommendations. Briefly, results indicated deficits primarily in learning/memory and verbal fluency. In the setting of largely preserved functional independence, findings support a diagnosis of mild cognitive impairment. The underlying etiology is not entirely clear at this time. Neuroimaging showed ventriculomegaly, possibly disproportionate to the degree of cerebral atrophy; however, the patient does not demonstrate the classic triad or neuropsychological testing pattern of normal pressure hydrocephalus. The degree of atrophy and 0% memory retention raises concern for a neurodegenerative process such as Alzheimer's disease, though his cognitive profile is not fully consistent with typical Alzheimer's either. Imaging revealed no significant white matter disease to explain the deficits. He is taking oxybutynin , which has anticholinergic properties, but this alone is unlikely to account for the severity of impairment. No psychological, sleep-related, physical, or lifestyle factors were identified to suggest alternative causes of cognitive decline.   Today, the patient was accompanied by his wife. They were provided verbal feedback regarding the findings and impression during this visit, and their questions were answered. A copy of the report was provided  at the conclusion of the visit.  DISPOSITION   Patient will follow up with the referring provider, Dr. Potters. He should return for repeat neuropsychological testing in 12-18 months to monitor his course and assist with diagnosis and treatment planning.  SERVICE   This feedback session was conducted by Renda Beckwith, Psy.D. [place] unit of [id number] (45 minutes) was billed for Dr. Beckwith' time spent in preparing, conducting, and documenting the current feedback session.  This report was generated using voice recognition software. While this document has been carefully reviewed, transcription errors may be present. I apologize in advance for any inconvenience. Please contact me if further clarification is needed.

## 2024-09-23 ENCOUNTER — Telehealth: Payer: Self-pay | Admitting: Neurology

## 2024-09-23 NOTE — Telephone Encounter (Signed)
 Called patient to discuss the results of his recent neuropsych testing. This was consistent with mild cognitive impairment. The pattern of deficits and his walking are still not consistent with NPH. He again denies urinary symptoms or changes in gait.   His mother had Alzheimer's disease, so this is a potential concern. I explained that his testing was not currently consistent with dementia and there was no sign he would progress to dementia, but that he could.  I explained that there are treatments for early AD, but being on anticoagulation as he is, is a contraindication due to increased risk of brain bleed on those medications. Even a lumbar puncture to collect biomarkers or evaluate for NPH would be difficult given he would have to stop eliquis .  I discussed that he could pursue blood biomarkers for AD, but this would be for his information, not to change treatment. He could consider starting a medication such as Aricept, if he would like.  Patient and his wife decided that they wanted to monitor his symptoms for now and rediscuss at follow up next year.  All questions were answered.  Venetia Potters, MD [facility]

## 2024-10-07 ENCOUNTER — Encounter: Payer: Self-pay | Admitting: Physician Assistant

## 2024-10-12 DIAGNOSIS — N400 Enlarged prostate without lower urinary tract symptoms: Secondary | ICD-10-CM | POA: Diagnosis not present

## 2024-10-12 DIAGNOSIS — M47816 Spondylosis without myelopathy or radiculopathy, lumbar region: Secondary | ICD-10-CM | POA: Diagnosis not present

## 2024-10-12 DIAGNOSIS — N1831 Chronic kidney disease, stage 3a: Secondary | ICD-10-CM | POA: Diagnosis not present

## 2024-10-12 DIAGNOSIS — I10 Essential (primary) hypertension: Secondary | ICD-10-CM | POA: Diagnosis not present

## 2024-10-15 ENCOUNTER — Ambulatory Visit: Payer: Medicare PPO

## 2024-10-15 DIAGNOSIS — I480 Paroxysmal atrial fibrillation: Secondary | ICD-10-CM

## 2024-10-16 ENCOUNTER — Ambulatory Visit: Payer: Self-pay | Admitting: Cardiology

## 2024-10-16 LAB — CUP PACEART REMOTE DEVICE CHECK
Battery Remaining Longevity: 56 mo
Battery Voltage: 2.94 V
Brady Statistic AS VP Percent: 87.69 %
Brady Statistic AS VS Percent: 0 %
Brady Statistic RV Percent Paced: 99.98 %
Date Time Interrogation Session: 20251203084529
Implantable Pulse Generator Implant Date: 20201103
Lead Channel Impedance Value: 520 Ohm
Lead Channel Pacing Threshold Amplitude: 0.5 V
Lead Channel Pacing Threshold Pulse Width: 0.24 ms
Lead Channel Sensing Intrinsic Amplitude: 18.45 mV
Lead Channel Setting Pacing Amplitude: 1 V
Lead Channel Setting Pacing Pulse Width: 0.24 ms
Lead Channel Setting Sensing Sensitivity: 2 mV

## 2024-10-21 NOTE — Progress Notes (Signed)
 Remote PPM Transmission

## 2024-11-10 NOTE — Progress Notes (Signed)
 "  NEUROLOGY FOLLOW UP OFFICE NOTE  Leonard Maldonado [id number]  Subjective:  Leonard Maldonado is a 73 y.o. 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  To briefly review: Patient was camping in [place] and went to get out of bed on 03/06/24 and felt very lightheaded/dizzy and had difficulty getting out of bed. It was not room spinning and not boat rocking sensation. He has difficulty describing his symptoms. When he got up, he was lightheaded and felt off. His wife drove him to [place]. He was walking slow and cautious per wife. They went to [facility] ED and was sent to [facility] and admitted overnight. His symptoms resolved within 30 minutes and have not recurred. CTH, CTA, and MRI brain showed no acute process. MRI brain did show mildly enlarged ventricles per radiology and ?NPH. Echocardiogram was normal. There was concern for TIA so patient was put on DAPT with asa and plavix  for 21 days. He took plavix  until 5/15 then continued asa monotherapy. He is on simvastatin  40 mg daily for HLD.   Cardiology detected afib on extended EKG. He was started on eliquis  03/28/24 and stopped aspirin .   He is a former smoker and quit 50 years ago.    EtOH use: Rare Restrictive diet: no Family history of neurologic problems: mother with Alzheimer's dementia   NPH questions: Urinary symptoms? None Cognitive changes? No per husband. Per wife, he gets confused with directions with driving sometimes. He doesn't know his calendar sometimes either. Imbalance? None  Most recent Assessment and Plan (05/15/24): Leonard Maldonado is a 73 y.o. male who presents for evaluation of transient episode of lightheadedness/dizziness. He has a relevant medical history of afib, HTN, HLD, 2nd degree AV  block s/p PM, GERD, diverticulitis, nephrolithiasis, lumbar spine disease, CKD3, BPH. His neurological examination is pertinent for MoCA of 21/30, but otherwise normal. Available diagnostic data is significant for MRI brain that showed mildly enlarged ventricles per radiology with possible question of NPH in their read. Patient does not have characteristic magnetic gait or urinary incontinence that would support NPH. His wife has noticed cognitive changes though (not patient). His MoCA supports this. This could represent mild cognitive impairment and with a family history of Alzheimer's dementia, this could also be potential concern in the future. Regarding his transient episode of lightheadedness and dizziness that lasted less than 30 minutes, this if of unclear etiology, but likely safest to treat as TIA. Patient is on appropriate anticoagulation now for newly discovered afib and statin for HLD.   PLAN: -Blood work: TSH, B1, B12 -Discussed neuropysch testing for further work up of cognitive changes. Patient and wife to discuss and let me know if they want to pursue this. -Continue eliquis  5 mg BID -Continue simvastatin  40 mg daily -Stroke warning signs discussed  Since their last visit: Labs were normal. Patient decided to proceed with neuropsych testing, which on 09/04/24 showed mild cognitive impairment (see 09/04/24 encounter by Dr. Gayland for full report). I spoke to patient on 09/23/24, per my telephone note: Called patient to discuss the results of his recent neuropsych testing. This was consistent with mild cognitive impairment. The pattern of deficits and his walking are still not consistent with NPH. He again denies urinary symptoms or changes in gait.    His mother  had Alzheimer's disease, so this is a potential concern. I explained that his testing was not currently consistent with dementia and there was no sign he would progress to dementia, but that he could.   I explained that there  are treatments for early AD, but being on anticoagulation as he is, is a contraindication due to increased risk of brain bleed on those medications. Even a lumbar puncture to collect biomarkers or evaluate for NPH would be difficult given he would have to stop eliquis .   I discussed that he could pursue blood biomarkers for AD, but this would be for his information, not to change treatment. He could consider starting a medication such as Aricept , if he would like.   Patient and his wife decided that they wanted to monitor his symptoms for now and rediscuss at follow up next year.  He has had no further episodes of dizziness/lightheadedness.  In terms of cognition, he feels he is doing okay. He denies any difficulty taking care of himself. He is not getting lost and is still driving. Per wife, she mostly agrees. She sometimes has to tell him directions.  He denies significant imbalance and has had no falls. He denies any urinary incontinence but does take oxybutynin  from urology for urinary urgency.  MEDICATIONS:  Outpatient Encounter Medications as of 11/20/2024  Medication Sig   acetaminophen  (TYLENOL ) 650 MG CR tablet Take 650 mg by mouth every 8 (eight) hours as needed for pain.   apixaban  (ELIQUIS ) 5 MG TABS tablet Take 1 tablet (5 mg total) by mouth 2 (two) times daily. This is a sample   cholecalciferol (VITAMIN D) 1000 UNITS tablet Take 1,000 Units by mouth daily.   COENZYME Q-10 PO Take 1 tablet by mouth daily.   finasteride  (PROSCAR ) 5 MG tablet Take 5 mg by mouth every evening.    Multiple Vitamin (MULTIVITAMIN) tablet Take 1 tablet by mouth daily.   pantoprazole  (PROTONIX ) 40 MG tablet Take 40 mg by mouth 2 (two) times daily.   sildenafil (REVATIO) 20 MG tablet Take 20-40 mg by mouth as needed (for ED).    simvastatin  (ZOCOR ) 40 MG tablet Take 1 tablet (40 mg total) by mouth at bedtime.   tamsulosin  (FLOMAX ) 0.4 MG CAPS capsule Take 0.4 mg by mouth 2 (two) times daily.     tolterodine (DETROL LA) 4 MG 24 hr capsule Take 4 mg by mouth daily.   apixaban  (ELIQUIS ) 5 MG TABS tablet Take 1 tablet (5 mg total) by mouth 2 (two) times daily. This is a sample (Patient not taking: Reported on 06/12/2024)   apixaban  (ELIQUIS ) 5 MG TABS tablet Take 1 tablet (5 mg total) by mouth 2 (two) times daily. (Patient not taking: Reported on 06/12/2024)   Multiple Vitamins-Minerals (EMERGEN-C VITAMIN C) PACK Take 1 Dose by mouth 2 (two) times a week. (Patient not taking: Reported on 11/20/2024)   Omega-3 Fatty Acids (FISH OIL) 1200 MG CAPS Take 1,200 mg by mouth daily.  (Patient not taking: Reported on 11/20/2024)   oxybutynin  (DITROPAN -XL) 10 MG 24 hr tablet Take 10 mg by mouth daily. (Patient not taking: Reported on 11/20/2024)   No facility-administered encounter medications on file as of 11/20/2024.    PAST MEDICAL HISTORY: Past Medical History:  Diagnosis Date   HTN (hypertension)    Hyperlipemia    Kidney calculus    Second degree AV block     PAST SURGICAL HISTORY: Past Surgical History:  Procedure Laterality Date   COLONOSCOPY WITH PROPOFOL  N/A  11/04/2015   Procedure: COLONOSCOPY WITH PROPOFOL ;  Surgeon: Gladis MARLA Louder, MD;  Location: [facility];  Service: Endoscopy;  Laterality: N/A;   EXTRACORPOREAL SHOCK WAVE LITHOTRIPSY Left 10/10/2021   Procedure: EXTRACORPOREAL SHOCK WAVE LITHOTRIPSY (ESWL);  Surgeon: Carolee Sherwood JONETTA DOUGLAS, MD;  Location: [facility];  Service: Urology;  Laterality: Left;   PACEMAKER LEADLESS INSERTION N/A 09/16/2019   Procedure: PACEMAKER LEADLESS INSERTION;  Surgeon: Kelsie Agent, MD;  Location: [facility];  Service: Cardiovascular;  Laterality: N/A;   SHOULDER SURGERY      ALLERGIES: Allergies[1]  FAMILY HISTORY: Family History  Problem Relation Age of Onset   Emphysema Father    Lung cancer Father     SOCIAL HISTORY: Social History[2] Social History   Social History Narrative   Are you right handed or left  handed? Right   Are you currently employed ?    What is your current occupation? Retired   Do you live at home alone?   Who lives with you? wife   What type of home do you live in: 1 story or 2 story? One and a half   Caffeine  none       Objective:  Vital Signs:  BP 109/69   Pulse 81   Ht 6' 2 (1.88 m)   Wt 212 lb (96.2 kg)   SpO2 98%   BMI 27.22 kg/m   General: No acute distress.  Patient appears well-groomed.   Head:  Normocephalic/atraumatic Neck: supple Heart: regular rate and rhythm Lungs: Clear to auscultation bilaterally. Vascular: No carotid bruits.  Neurological Exam: Mental status: alert and oriented, speech fluent and not dysarthric, language intact.  Cranial nerves: CN I: not tested CN II: pupils equal, round and reactive to light, visual fields intact CN III, IV, VI:  full range of motion, no nystagmus, no ptosis CN V: facial sensation intact. CN VII: upper and lower face symmetric CN VIII: hearing intact CN IX, X: uvula midline CN XI: sternocleidomastoid and trapezius muscles intact CN XII: tongue midline  Bulk & Tone: normal. Motor:  muscle strength 5/5 throughout Deep Tendon Reflexes:  2+ throughout  Sensation:  Light touch sensation intact. Finger to nose testing:  Without dysmetria.   Gait:  Normal station and stride.  Labs and Imaging review: New results: 05/15/24: B12: 475 B1 wnl TSH wnl  Neuropsych testing (09/04/24):  SUMMARY OF RESULTS    Premorbid cognitive abilities are estimated to be in the low average range based on word reading and sociodemographic factors. Relative to this baseline estimate, current performance was below expectations across aspects of learning/memory, language, and attention/working memory.   Specifically, he demonstrated markedly poor delayed recall across all memory tasks--including a word list, short stories, a complex figure, and shapes--with retention at 0% for each measure. While this may partly reflect  limited learning of the material, there is also concern for potential memory storage difficulties. For example, on a task where he had the advantage of carefully copying a complex figure at his own pace, he was still unable to recall a single detail after only just three minutes. Recognition memory was variable, with adequate performance for short stories and shapes but poor performance for the word list.  Additionally, he demonstrated difficulty on tasks of verbal fluency, while confrontation naming was intact. Performance on a visual working memory task was poor, while performance on auditory working memory tasks was intact.    Remaining measures of processing speed, executive functioning, and visuospatial abilities  were within age-related expectations.   On self-report questionnaires, he did not endorse significant levels of depression or anxiety.    DIAGNOSTIC IMPRESSION    Results of the current evaluation indicated deficits primarily in learning/memory and verbal fluency. In the setting of largely preserved functional independence, findings support a diagnosis of mild cognitive impairment. The underlying etiology is not entirely clear at this time.    Neuroimaging was notable for ventriculomegaly, which was described as questionably disproportionate with the degree of cerebral atrophy. At this time, the patient does not convincingly exhibit the classic triad of symptoms associated with normal pressure hydrocephalus (NPH); specifically, he demonstrates no gait disturbance, and although he reports urinary incontinence, his medical history includes benign prostatic hyperplasia and chronic kidney disease, which could account for this symptom. While the characteristic neuropsychological profile associated with NPH--often including significant executive dysfunction, psychomotor slowing, and apathy--is not unique to the condition, this pattern is not clearly evident in this patient.    The degree of  cerebral atrophy and memory impairment (particularly 0% retention) raises concern for a possible underlying neurodegenerative disorder, such as Alzheimers disease; however, his cognitive profile does not align entirely with a typical Alzheimers pattern either. Notably, memory storage at times appears relatively preserved compared with encoding and retrieval, complicating the distinction between frontal-subcortical and mesial-temporal involvement.  Neuroimaging does not indicate significant white matter disease that could account for his memory deficits. He is taking oxybutynin , which has known anticholinergic properties; however, a single medication, particularly at a standard dose, is unlikely to be the main cause of the observed memory impairment. Finally, no significant psychological/emotional, sleep-related, physical, or lifestyle factors were identified that would suggest alternative etiologies for cognitive decline.   Further neurological workup--such as blood-based biomarkers or cerebrospinal fluid analysis--may provide additional diagnostic clarification. Serial assessment will also be helpful to track progression and further clarify the underlying etiology.    Previously reviewed results: 03/07/24: Lipid panel: tChol 137, LDL 76, TG 72 HbA1c: 4.8 CBC unremarkable CMP significant for Cr 1.46 (baseline 1.3-1.6)   TSH (09/16/2019): elevated to 4.702   Imaging/Procedures: CT head wo contrast (03/06/24): FINDINGS: Brain: No evidence of acute infarction, hemorrhage, hydrocephalus, extra-axial collection or mass lesion/mass effect. Ventriculomegaly with some disproportionate subarachnoid spaces but possibly congruous with a degree of atrophy. No chronic ischemic injury detected.   Vascular: No hyperdense vessel or unexpected calcification.   Skull: Normal. Negative for fracture or focal lesion.   Sinuses/Orbits: No acute finding.   IMPRESSION: No acute finding.   CTA head and neck  (03/06/24): IMPRESSION: No large vessel occlusion.   No high-grade stenosis, aneurysm, or vascular malformation of the arteries in the head and neck.   Aortic Atherosclerosis (ICD10-I70.0).   MRI brain wo contrast (03/07/24): IMPRESSION: 1. No evidence of an acute infarct. 2. Lateral and third ventriculomegaly, questionably disproportionate with the degree of cerebral atrophy. An acute callosal angle is also noted and normal pressure hydrocephalus (NPH) cannot be excluded. 3. 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 Otherwise unremarkable non-contrast MRI appearance of the brain for age.   Echocardiogram (03/07/24): 1. Left ventricular ejection fraction, by estimation, is 55 to 60%. The  left ventricle has normal function. The left ventricle has no regional  wall motion abnormalities. Left ventricular diastolic parameters are  consistent with Grade I diastolic  dysfunction (impaired relaxation).   2. Right ventricular systolic function is normal. The right ventricular  size is normal.   3. The mitral valve is normal  in structure. Trivial mitral valve  regurgitation. No evidence of mitral stenosis.   4. The aortic valve is calcified. There is mild calcification of the  aortic valve. Aortic valve regurgitation is trivial. Aortic valve  sclerosis/calcification is present, without any evidence of aortic  stenosis.  LA normal in size  Assessment/Plan:  This is Leonard Maldonado, a 73 y.o. male with: Mild cognitive impairment - neuropsych testing on 09/04/24. Has a family hx of AD. No clear functional deficits currently. While there is not definitve evidence patient will have AD, given that he is on Eliquis , I don't think he is a candidate for newer AD medications given risk of brain bleed on those medications. We discussed blood biomarkers vs treatment with Aricept  vs monitoring. They would like to try Aricept . Transient dizziness/lightheadedness - ?TIA - no recurrent  symptoms   Plan: -Start Aricept  5 mg at bedtime -Recommended regular physical exercise -May repeat neuropsych testing after 1-2 years -Continue eliquis  and simvastatin  for secondary stroke prevention -Stroke warning signs discussed  Return to clinic in 6 months  Total time spent reviewing records, interview, history/exam, documentation, and coordination of care on day of encounter:  45 min  Venetia Potters, MD     [1]  Allergies Allergen Reactions   Atorvastatin     Other Reaction(s): mylagia   Hydrochlorothiazide     Other Reaction(s): increased creatinine  [2]  Social History Tobacco Use   Smoking status: Former    Current packs/day: 0.00    Average packs/day: 1 pack/day for 10.0 years (10.0 ttl pk-yrs)    Types: Cigarettes    Start date: 03/18/1962    Quit date: 03/18/1972    Years since quitting: 52.7   Smokeless tobacco: Never  Vaping Use   Vaping status: Never Used  Substance Use Topics   Alcohol use: Yes    Comment: occasional   Drug use: No   "

## 2024-11-20 ENCOUNTER — Ambulatory Visit: Admitting: Neurology

## 2024-11-20 ENCOUNTER — Encounter: Payer: Self-pay | Admitting: Neurology

## 2024-11-20 VITALS — BP 109/69 | HR 81 | Ht 74.0 in | Wt 212.0 lb

## 2024-11-20 DIAGNOSIS — G3184 Mild cognitive impairment, so stated: Secondary | ICD-10-CM

## 2024-11-20 DIAGNOSIS — G459 Transient cerebral ischemic attack, unspecified: Secondary | ICD-10-CM | POA: Diagnosis not present

## 2024-11-20 MED ORDER — DONEPEZIL HCL 5 MG PO TABS: 5.0000 mg | ORAL_TABLET | Freq: Every day | ORAL | 3 refills | Status: AC

## 2024-11-20 NOTE — Patient Instructions (Addendum)
 We agreed to start Aricept  5 mg at bedtime for the mild cognitive impairment seen on your testing.  Please let me know if you have any issues with the medication.  I also recommend regular physical activity, including walking as this is good for physical and cognitive health.  Continue eliquis  and simvastatin .  If you have new difficulty speaking, face droop, numbness on one side of the body, weakness on one side of the body, or dizziness/imbalance, this could be the sign of a stroke. Don't wait, please call EMS and be evaluated at the nearest emergency room.   I plan to see you again in about 6 months. Please let me know if you have any questions or concerns in the meantime.   The physicians and staff at [facility] are committed to providing excellent care. You may receive a survey requesting feedback about your experience at our office. We strive to receive very good responses to the survey questions. If you feel that your experience would prevent you from giving the office a very good  response, please contact our office to try to remedy the situation. We may be reached at [phone]. Thank you for taking the time out of your busy day to complete the survey.  Venetia Potters, MD [facility]

## 2024-11-27 ENCOUNTER — Ambulatory Visit: Admitting: Podiatry

## 2024-11-27 DIAGNOSIS — M19071 Primary osteoarthritis, right ankle and foot: Secondary | ICD-10-CM

## 2024-11-27 NOTE — Progress Notes (Signed)
 "  Subjective:  Patient ID: Leonard Maldonado, male    DOB: 02/25/1951,  MRN: [id number]  Chief Complaint  Patient presents with   Foot Pain    Right foot pain that comes and go sharp pain feels like knives.  Pain is on the top of his foot. Pt stated that this has been going on for a while now. No recent injuries.     74 y.o. male presents with the above complaint.  Patient presents with right midfoot arthritis painful to touch is progressing and worse worse with ambulation and shoe pressure wanted get it evaluated.  He states that it is sharp pain feels like knives.  Is on top of his foot going on for a while no recent injury.  He has not seen anyone else prior to seeing me.   Review of Systems: Negative except as noted in the HPI. Denies N/V/F/Ch.  Past Medical History:  Diagnosis Date   HTN (hypertension)    Hyperlipemia    Kidney calculus    Second degree AV block    Current Medications[1]  Tobacco Use History[2]  Allergies[3] Objective:  There were no vitals filed for this visit. There is no height or weight on file to calculate BMI. Constitutional Well developed. Well nourished.  Vascular Dorsalis pedis pulses palpable bilaterally. Posterior tibial pulses palpable bilaterally. Capillary refill normal to all digits.  No cyanosis or clubbing noted. Pedal hair growth normal.  Neurologic Normal speech. Oriented to person, place, and time. Epicritic sensation to light touch grossly present bilaterally.  Dermatologic Nails well groomed and normal in appearance. No open wounds. No skin lesions.  Orthopedic: Pain on palpation of right dorsal foot midfoot arthritis clinically appreciated.  Pes cavus foot structure noted.  Positive Tinel sign noted to the dorsal foot.   Radiographs: None Assessment:   1. Arthritis of right midfoot    Plan:  Patient was evaluated and treated and all questions answered.  Right dorsal midfoot arthritis - All questions and concerns were  discussed with the patient extensive due to given the amount of pain that he is experiencing a benefit from steroid injection help decrease acute inflammatory complaints due to pain.  Patient agrees to plan of to proceed with steroid injection -A steroid injection was performed at right dorsal midfoot using 1% plain Lidocaine  and 10 mg of Kenalog. This was well tolerated.   No follow-ups on file.     [1]  Current Outpatient Medications:    acetaminophen  (TYLENOL ) 650 MG CR tablet, Take 650 mg by mouth every 8 (eight) hours as needed for pain., Disp: , Rfl:    apixaban  (ELIQUIS ) 5 MG TABS tablet, Take 1 tablet (5 mg total) by mouth 2 (two) times daily. This is a sample, Disp: 42 tablet, Rfl: 0   cholecalciferol (VITAMIN D) 1000 UNITS tablet, Take 1,000 Units by mouth daily., Disp: , Rfl:    COENZYME Q-10 PO, Take 1 tablet by mouth daily., Disp: , Rfl:    donepezil  (ARICEPT ) 5 MG tablet, Take 1 tablet (5 mg total) by mouth at bedtime., Disp: 90 tablet, Rfl: 3   finasteride  (PROSCAR ) 5 MG tablet, Take 5 mg by mouth every evening. , Disp: , Rfl: 3   Multiple Vitamin (MULTIVITAMIN) tablet, Take 1 tablet by mouth daily., Disp: , Rfl:    Multiple Vitamins-Minerals (EMERGEN-C VITAMIN C) PACK, Take 1 Dose by mouth 2 (two) times a week. (Patient not taking: Reported on 11/20/2024), Disp: , Rfl:    Omega-3 Fatty  Acids (FISH OIL) 1200 MG CAPS, Take 1,200 mg by mouth daily.  (Patient not taking: Reported on 11/20/2024), Disp: , Rfl:    oxybutynin  (DITROPAN -XL) 10 MG 24 hr tablet, Take 10 mg by mouth daily. (Patient not taking: Reported on 11/20/2024), Disp: , Rfl:    pantoprazole  (PROTONIX ) 40 MG tablet, Take 40 mg by mouth 2 (two) times daily., Disp: , Rfl:    sildenafil (REVATIO) 20 MG tablet, Take 20-40 mg by mouth as needed (for ED). , Disp: , Rfl:    simvastatin  (ZOCOR ) 40 MG tablet, Take 1 tablet (40 mg total) by mouth at bedtime., Disp: 30 tablet, Rfl: 2   tamsulosin  (FLOMAX ) 0.4 MG CAPS capsule, Take 0.4  mg by mouth 2 (two) times daily. , Disp: , Rfl: 3   tolterodine (DETROL LA) 4 MG 24 hr capsule, Take 4 mg by mouth daily., Disp: , Rfl:  [2]  Social History Tobacco Use  Smoking Status Former   Current packs/day: 0.00   Average packs/day: 1 pack/day for 10.0 years (10.0 ttl pk-yrs)   Types: Cigarettes   Start date: 03/18/1962   Quit date: 03/18/1972   Years since quitting: 52.7  Smokeless Tobacco Never  [3]  Allergies Allergen Reactions   Atorvastatin     Other Reaction(s): mylagia   Hydrochlorothiazide     Other Reaction(s): increased creatinine   "

## 2024-12-23 ENCOUNTER — Ambulatory Visit: Admitting: Physician Assistant

## 2025-01-14 ENCOUNTER — Ambulatory Visit: Payer: Medicare PPO

## 2025-04-15 ENCOUNTER — Ambulatory Visit: Payer: Medicare PPO

## 2025-05-28 ENCOUNTER — Ambulatory Visit: Payer: Self-pay | Admitting: Neurology
